# Patient Record
Sex: Female | Born: 1990
Health system: Southern US, Community
[De-identification: ages and names within clinical notes are randomized; demographics above are authoritative.]

## PROBLEM LIST (undated history)

## (undated) DIAGNOSIS — G43909 Migraine, unspecified, not intractable, without status migrainosus: Secondary | ICD-10-CM

## (undated) DIAGNOSIS — S060XAA Concussion with loss of consciousness status unknown, initial encounter: Secondary | ICD-10-CM

## (undated) HISTORY — DX: Concussion with loss of consciousness status unknown, initial encounter: S06.0XAA

## (undated) HISTORY — DX: Migraine, unspecified, not intractable, without status migrainosus: G43.909

## (undated) HISTORY — PX: ANTERIOR CRUCIATE LIGAMENT REPAIR: SHX115

---

## 2010-04-09 ENCOUNTER — Emergency Department: Payer: Self-pay | Admitting: Emergency Medicine

## 2011-11-15 ENCOUNTER — Emergency Department: Payer: Self-pay | Admitting: Emergency Medicine

## 2011-11-16 ENCOUNTER — Emergency Department: Payer: Self-pay | Admitting: Emergency Medicine

## 2012-05-19 ENCOUNTER — Emergency Department: Payer: Self-pay | Admitting: Emergency Medicine

## 2012-05-19 LAB — LIPASE, BLOOD: Lipase: 83 U/L (ref 73–393)

## 2012-05-19 LAB — URINALYSIS, COMPLETE
Bilirubin,UR: NEGATIVE
Blood: NEGATIVE
Glucose,UR: NEGATIVE mg/dL (ref 0–75)
Ketone: NEGATIVE
RBC,UR: 1 /HPF (ref 0–5)
Squamous Epithelial: 3

## 2012-05-19 LAB — CBC
HCT: 40 % (ref 35.0–47.0)
MCV: 92 fL (ref 80–100)
Platelet: 335 10*3/uL (ref 150–440)
RBC: 4.34 10*6/uL (ref 3.80–5.20)
WBC: 15.1 10*3/uL — ABNORMAL HIGH (ref 3.6–11.0)

## 2012-05-19 LAB — COMPREHENSIVE METABOLIC PANEL
Albumin: 4 g/dL (ref 3.4–5.0)
Alkaline Phosphatase: 88 U/L (ref 50–136)
Anion Gap: 4 — ABNORMAL LOW (ref 7–16)
BUN: 12 mg/dL (ref 7–18)
Bilirubin,Total: 0.7 mg/dL (ref 0.2–1.0)
Calcium, Total: 9.5 mg/dL (ref 8.5–10.1)
Co2: 29 mmol/L (ref 21–32)
EGFR (Non-African Amer.): >60
Glucose: 96 mg/dL (ref 65–99)
Osmolality: 273 (ref 275–301)
SGOT(AST): 25 U/L (ref 15–37)
Sodium: 137 mmol/L (ref 136–145)

## 2012-10-04 ENCOUNTER — Emergency Department: Payer: Self-pay | Admitting: Emergency Medicine

## 2012-12-17 ENCOUNTER — Emergency Department: Payer: Self-pay | Admitting: Emergency Medicine

## 2013-04-05 ENCOUNTER — Emergency Department (HOSPITAL_COMMUNITY)
Admission: EM | Admit: 2013-04-05 | Discharge: 2013-04-05 | Disposition: A | Discharge status: Home / Self Care | Payer: BC Managed Care – PPO | Attending: Emergency Medicine | Admitting: Emergency Medicine

## 2013-04-05 ENCOUNTER — Encounter (HOSPITAL_COMMUNITY): Payer: Self-pay | Admitting: Emergency Medicine

## 2013-04-05 DIAGNOSIS — M545 Low back pain, unspecified: Secondary | ICD-10-CM | POA: Insufficient documentation

## 2013-04-05 DIAGNOSIS — R110 Nausea: Secondary | ICD-10-CM | POA: Insufficient documentation

## 2013-04-05 DIAGNOSIS — Z79899 Other long term (current) drug therapy: Secondary | ICD-10-CM | POA: Insufficient documentation

## 2013-04-05 DIAGNOSIS — R300 Dysuria: Secondary | ICD-10-CM | POA: Insufficient documentation

## 2013-04-05 DIAGNOSIS — F17200 Nicotine dependence, unspecified, uncomplicated: Secondary | ICD-10-CM | POA: Insufficient documentation

## 2013-04-05 DIAGNOSIS — N946 Dysmenorrhea, unspecified: Secondary | ICD-10-CM | POA: Insufficient documentation

## 2013-04-05 MED ORDER — NAPROXEN 250 MG PO TABS
500.0000 mg | ORAL_TABLET | Freq: Once | ORAL | Status: AC
  Administered 2013-04-05: 500 mg via ORAL
  Filled 2013-04-05: qty 2

## 2013-04-05 MED ORDER — NAPROXEN 500 MG PO TABS: 500.0000 mg | ORAL_TABLET | Freq: Two times a day (BID) | ORAL | Status: DC

## 2013-04-05 NOTE — ED Provider Notes (Signed)
CSN: [id number]     Arrival date & time 04/05/13  1317 History   First MD Initiated Contact with Patient 04/05/13 1323     Chief Complaint  Patient presents with  . Abdominal Cramping   (Consider location/radiation/quality/duration/timing/severity/associated sxs/prior Treatment) HPI Comments: Patient is a 22 year old female with a history of dysmenorrhea who presents for abdominal cramping associated with her menstrual cycle which began today. Patient states the pain is in her lower abdomen and low back. Cramping has been constant since onset without modifying factors. Patient has taken ibuprofen for her symptoms without relief. Patient states that she usually takes naproxen for her cramps without relief. She endorses associated nausea and pressure with urination which is also typical for her while menstruating. She denies fever, CP, SOB, V/D, vaginal discharge, melena, hematochezia, and numbness/tingling.   Patient is a 22 y.o. female presenting with cramps. The history is provided by the patient. No language interpreter was used.  Abdominal Cramping Associated symptoms include abdominal pain (lower cramping) and nausea. Pertinent negatives include no chest pain or fever.    History reviewed. No pertinent past medical history. History reviewed. No pertinent past surgical history. History reviewed. No pertinent family history. History  Substance Use Topics  . Smoking status: Current Every Day Smoker    Types: Cigarettes  . Smokeless tobacco: Not on file  . Alcohol Use: Yes   OB History   Grav Para Term Preterm Abortions TAB SAB Ect Mult Living                 Review of Systems  Constitutional: Negative for fever.  Respiratory: Negative for shortness of breath.   Cardiovascular: Negative for chest pain.  Gastrointestinal: Positive for nausea and abdominal pain (lower cramping).  Genitourinary: Positive for dysuria.  All other systems reviewed and are negative.    Allergies   Nickel  Home Medications   Current Outpatient Rx  Name  Route  Sig  Dispense  Refill  . Ibuprofen (IBU PO)   Oral   Take 6 tablets by mouth once.         . naproxen (NAPROSYN) 500 MG tablet   Oral   Take 1 tablet (500 mg total) by mouth 2 (two) times daily.   30 tablet   0    BP 143/77  Pulse 94  Temp(Src) 97.6 F (36.4 C) (Oral)  Resp 18  SpO2 99%  LMP 04/05/2013  Physical Exam  Nursing note and vitals reviewed. Constitutional: She is oriented to person, place, and time. She appears well-developed and well-nourished. No distress.  HENT:  Head: Normocephalic and atraumatic.  Mouth/Throat: Oropharynx is clear and moist. No oropharyngeal exudate.  Eyes: Conjunctivae and EOM are normal. Pupils are equal, round, and reactive to light. No scleral icterus.  Neck: Normal range of motion.  Cardiovascular: Normal rate, regular rhythm, normal heart sounds and intact distal pulses.   Pulmonary/Chest: Effort normal and breath sounds normal. No respiratory distress. She has no wheezes. She has no rales.  Abdominal: Soft. She exhibits no distension. There is no tenderness. There is no rebound and no guarding.  No focal tenderness or peritoneal signs  Musculoskeletal: Normal range of motion.  Neurological: She is alert and oriented to person, place, and time.  Skin: Skin is warm and dry. No rash noted. She is not diaphoretic. No erythema. No pallor.  Psychiatric: She has a normal mood and affect. Her behavior is normal.    ED Course  Procedures (including critical care time) Labs  Review Labs Reviewed - No data to display Imaging Review No results found.  EKG Interpretation   None       MDM   1. Dysmenorrhea    22 year old female presents for dysmenorrhea with onset this morning. Patient states that symptoms are c/w prior episodes of dysmenorrhea. No new or worsening symptoms compared to past. Patient is well and nontoxic appearing, hemodynamically stable, and  afebrile. No focal tenderness or peritoneal signs appreciated on abdominal exam. Lungs clear to auscultation bilaterally and heart regular rate and rhythm. Patient given naproxen in the ED for symptoms. She is appropriate for discharge with OB/GYN followup as needed. Will provide prescription for naproxen to take as needed for symptoms. Return precautions discussed and patient agreeable to plan of no unaddressed concerns.    Antony Madura, PA-C 04/05/13 1428

## 2013-04-05 NOTE — ED Notes (Signed)
Reports waking up this am, is having menstrual cycle and severe abd cramping. Pt states she just needs naproxen for the pain. No distress noted at triage.

## 2013-04-05 NOTE — ED Notes (Signed)
Pt dc to home. Pt sts understanding to dc instructions. Pt ambulatory to exit without difficulty.  Pt denies need for w/c.  

## 2013-04-05 NOTE — ED Provider Notes (Signed)
Medical screening examination/treatment/procedure(s) were performed by non-physician practitioner and as supervising physician I was immediately available for consultation/collaboration.  EKG Interpretation   None         Layla Maw Shivam Mestas, DO 04/05/13 1623

## 2013-08-01 ENCOUNTER — Emergency Department: Payer: Self-pay | Admitting: Emergency Medicine

## 2014-02-24 ENCOUNTER — Emergency Department: Payer: Self-pay | Admitting: Emergency Medicine

## 2014-10-11 ENCOUNTER — Emergency Department
Admit: 2014-10-11 | Disposition: A | Discharge status: Home / Self Care | Payer: Self-pay | Admitting: Emergency Medicine

## 2015-11-05 ENCOUNTER — Emergency Department
Admission: EM | Admit: 2015-11-05 | Discharge: 2015-11-05 | Disposition: A | Discharge status: Home / Self Care | Payer: Self-pay | Attending: Emergency Medicine | Admitting: Emergency Medicine

## 2015-11-05 ENCOUNTER — Encounter: Payer: Self-pay | Admitting: Emergency Medicine

## 2015-11-05 DIAGNOSIS — K122 Cellulitis and abscess of mouth: Secondary | ICD-10-CM | POA: Insufficient documentation

## 2015-11-05 DIAGNOSIS — Z791 Long term (current) use of non-steroidal anti-inflammatories (NSAID): Secondary | ICD-10-CM | POA: Insufficient documentation

## 2015-11-05 DIAGNOSIS — F17210 Nicotine dependence, cigarettes, uncomplicated: Secondary | ICD-10-CM | POA: Insufficient documentation

## 2015-11-05 MED ORDER — AMOXICILLIN 500 MG PO TABS: 500.0000 mg | ORAL_TABLET | Freq: Three times a day (TID) | ORAL | Status: DC

## 2015-11-05 MED ORDER — MAGIC MOUTHWASH W/LIDOCAINE: 5.0000 mL | Freq: Four times a day (QID) | ORAL | Status: DC | PRN

## 2015-11-05 NOTE — ED Notes (Signed)
Pt presents to ED c/o swelling and pain to L side of gums starting last night and getting progressively worse. Pt used salt water flush, peroxide, and motrin at home without relief.

## 2015-11-05 NOTE — Discharge Instructions (Signed)
Pericoronitis  Pericoronitis is redness, pain, and swelling (inflammation) of the gum that surrounds a tooth that has not come all the way through the gum (partially erupted or impacted). It occurs when food particles and bacteria get trapped between the gum and the tooth and cause an infection.  Pericoronitis usually affects the bottom wisdom teeth. Wisdom teeth are also called third molars. These are the last teeth to erupt, usually when people are 17-21 years old. Pericoronitis is most common in people in their 20s.  Pericoronitis may start suddenly (acute) and can cause pain and swelling. The infection can also spread to the soft tissues around the tooth and lower jaw.  CAUSES   The bacteria that normally live in your mouth (anaerobic bacteria) are the usual cause of pericoronitis.   RISK FACTORS  The main risk factor for pericoronitis is a wisdom tooth that is coming in slowly or sideways. Other risk factors include:  · Poor dental care (oral hygiene).  · An upper molar that irritates the lower molar when chewing (occlusal trauma).  · Gum disease (gingivitis).  · Lowered resistance to infection. Illness and pregnancy are among possible causes of this.  SIGNS AND SYMPTOMS  Signs and symptoms of acute pericoronitis include:  · Pain.  · Redness and swelling of the gum.  · Swelling of the jaw.  · Bad breath.  · Bad taste in the mouth.  · Fever.  · Stiff and painful jaw movement (trismus).  DIAGNOSIS   Your dentist can usually diagnose pericoronitis by your symptoms. You will also have a dental exam including X-rays.  TREATMENT   Treatment of acute pericoronitis may include:  · Injecting numbing medicine and germ-killing solution into the infected area, then opening the gum area over the tooth to flush out pus, bacteria, and trapped food particles (debris).  · Grinding down an upper tooth that is causing occlusal trauma.  · Prescribing oral antibiotic medicine.  If you have pericoronitis that keeps coming back, you  may need to have the affected wisdom tooth removed (extracted).  HOME CARE INSTRUCTIONS  · Take medicines only as directed by your dentist.  · If you were prescribed an antibiotic medicine, finish it all even if you start to feel better.  · Eat soft foods until pain and swelling have gone away.  · Rinse your mouth with warm salt water every few hours, or use a mouthwash that is recommended by your dentist.  · Practice good oral hygiene by flossing and brushing frequently.  · Keep all follow-up visits as directed by your dentist. This is important.  SEEK MEDICAL CARE IF:  · You have pain or swelling in your gum or jaw.  · You have a fever.  · You have a constant bad taste in your mouth or bad breath.  SEEK IMMEDIATE MEDICAL CARE IF:  You have swelling in your mouth or jaw that makes it hard to swallow or breathe.     This information is not intended to replace advice given to you by your health care provider. Make sure you discuss any questions you have with your health care provider.     Document Released: 07/08/2004 Document Revised: 06/21/2014 Document Reviewed: 10/31/2013  Elsevier Interactive Patient Education ©2016 [employer]

## 2015-11-05 NOTE — ED Provider Notes (Signed)
[facility] Emergency Department Provider Note  ____________________________________________  Time seen: Approximately 3:57 PM  I have reviewed the triage vital signs and the nursing notes.   HISTORY  Chief Complaint Oral Swelling    HPI Stefanie Parks is a 25 y.o. female , NAD, presents to the emergency department with one-day history of swelling to the left side of her gums. States she eats a lot of sunflower seeds and believes a serious stuck inside of the gum of her left lower back molar. Has tried to flush was sought warm peroxide as well as been using a toothpick to try to get the seed out. Does state she removed some of the seed from her gumline. Denies any dental pain nor injury to the tooth. Has not had any fevers or chills. Denies abdominal pain, nausea, vomiting.   History reviewed. No pertinent past medical history.  There are no active problems to display for this patient.   History reviewed. No pertinent past surgical history.  Current Outpatient Rx  Name  Route  Sig  Dispense  Refill  . amoxicillin (AMOXIL) 500 MG tablet   Oral   Take 1 tablet (500 mg total) by mouth 3 (three) times daily with meals.   30 tablet   0   . Ibuprofen (IBU PO)   Oral   Take 6 tablets by mouth once.         . magic mouthwash w/lidocaine SOLN   Oral   Take 5 mLs by mouth 4 (four) times daily as needed for mouth pain.   240 mL   0     Please mix 80mL diphenhydramine, 80mL nystatin, 80 ...   . naproxen (NAPROSYN) 500 MG tablet   Oral   Take 1 tablet (500 mg total) by mouth 2 (two) times daily.   30 tablet   0     Allergies Nickel  History reviewed. No pertinent family history.  Social History Social History  Substance Use Topics  . Smoking status: Current Every Day Smoker    Types: Cigarettes  . Smokeless tobacco: None  . Alcohol Use: Yes     Review of Systems  Constitutional: No fever/chills Eyes: No visual changes.  ENT:  Positive redness swelling to left lower gum line. No cracked teeth or dental caries. Cardiovascular: No chest pain. Respiratory: No cough. No shortness of breath. No wheezing.  Gastrointestinal: No abdominal pain.  No nausea, vomiting.   Musculoskeletal: Negative for neck pain.  Skin: Negative for rash. Neurological: Negative for headaches, focal weakness or numbness. 10-point ROS otherwise negative.  ____________________________________________   PHYSICAL EXAM:  VITAL SIGNS: ED Triage Vitals  Enc Vitals Group     BP --      Pulse --      Resp --      Temp --      Temp src --      SpO2 --      Weight --      Height --      Head Cir --      Peak Flow --      Pain Score --      Pain Loc --      Pain Edu? --      Excl. in GC? --      Constitutional: Alert and oriented. Well appearing and in no acute distress. Eyes: Conjunctivae are normal.  Head: Atraumatic. ENT:            Nose: No  congestion/rhinnorhea.      Mouth/Throat: Mild erythema and swelling noted about the left lower posterior gumline about the last molar. No evidence of foreign body. Mild tenderness to palpation. Dental hygiene is overall good. No evidence of cracked tooth nor [place] in this tooth. Mucous membranes are moist. Pharynx without erythema, swelling, exudates. Uvula is midline. Neck: Supple with full range of motion Hematological/Lymphatic/Immunilogical: No cervical lymphadenopathy. Respiratory: Normal respiratory effort without tachypnea or retractions.  Musculoskeletal: No TMJ pain to palpation or crepitus with opening of the jaw. No jaw tenderness to palpation. Neurologic:  Normal speech and language. No gross focal neurologic deficits are appreciated.  Skin:  Skin is warm, dry and intact. No rash noted. Psychiatric: Mood and affect are normal. Speech and behavior are normal. Patient exhibits appropriate insight and judgement.   ____________________________________________    LABS  None ____________________________________________  EKG  None ____________________________________________  RADIOLOGY  None ____________________________________________    PROCEDURES  Procedure(s) performed: None   Medications - No data to display   ____________________________________________   INITIAL IMPRESSION / ASSESSMENT AND PLAN / ED COURSE  Patient's diagnosis is consistent with cellulitis oral soft tissues. Patient will be discharged home with prescriptions for amoxicillin and Magic mouthwash with lidocaine to use as directed. Patient is to follow up with her dentist for panoramic x-rays and further evaluation as needed. Advised patient to discontinue use of toothpick as it could cause increased infection and open wounds to the gumline. Advised to use floss instead. Patient is given ED precautions to return to the ED for any worsening or new symptoms.    ____________________________________________  FINAL CLINICAL IMPRESSION(S) / ED DIAGNOSES  Final diagnoses:  Cellulitis of oral soft tissues      NEW MEDICATIONS STARTED DURING THIS VISIT:  Discharge Medication List as of 11/05/2015  4:19 PM    START taking these medications   Details  amoxicillin (AMOXIL) 500 MG tablet Take 1 tablet (500 mg total) by mouth 3 (three) times daily with meals., Starting 11/05/2015, Until Discontinued, Print    magic mouthwash w/lidocaine SOLN Take 5 mLs by mouth 4 (four) times daily as needed for mouth pain., Starting 11/05/2015, Until Discontinued, Print             Hope Pigeon, PA-C 11/05/15 1658  Minna Antis, MD 11/05/15 2306

## 2015-11-05 NOTE — ED Notes (Signed)
Discussed discharge instructions, prescriptions, and follow-up care with patient. No questions or concerns at this time. Pt stable at discharge.  

## 2016-04-10 ENCOUNTER — Emergency Department: Payer: PRIVATE HEALTH INSURANCE

## 2016-04-10 ENCOUNTER — Emergency Department
Admission: EM | Admit: 2016-04-10 | Discharge: 2016-04-10 | Disposition: A | Discharge status: Home / Self Care | Payer: PRIVATE HEALTH INSURANCE | Attending: Emergency Medicine | Admitting: Emergency Medicine

## 2016-04-10 DIAGNOSIS — M25461 Effusion, right knee: Secondary | ICD-10-CM | POA: Diagnosis not present

## 2016-04-10 DIAGNOSIS — S8391XA Sprain of unspecified site of right knee, initial encounter: Secondary | ICD-10-CM | POA: Insufficient documentation

## 2016-04-10 DIAGNOSIS — Y929 Unspecified place or not applicable: Secondary | ICD-10-CM | POA: Diagnosis not present

## 2016-04-10 DIAGNOSIS — Y9389 Activity, other specified: Secondary | ICD-10-CM | POA: Diagnosis not present

## 2016-04-10 DIAGNOSIS — F17210 Nicotine dependence, cigarettes, uncomplicated: Secondary | ICD-10-CM | POA: Insufficient documentation

## 2016-04-10 DIAGNOSIS — Y999 Unspecified external cause status: Secondary | ICD-10-CM | POA: Diagnosis not present

## 2016-04-10 DIAGNOSIS — W500XXA Accidental hit or strike by another person, initial encounter: Secondary | ICD-10-CM | POA: Insufficient documentation

## 2016-04-10 DIAGNOSIS — S80911A Unspecified superficial injury of right knee, initial encounter: Secondary | ICD-10-CM | POA: Diagnosis present

## 2016-04-10 MED ORDER — MELOXICAM 15 MG PO TABS: 15.0000 mg | ORAL_TABLET | Freq: Every day | ORAL | 0 refills | Status: DC
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
## 2016-05-07 ENCOUNTER — Emergency Department
Admission: EM | Admit: 2016-05-07 | Discharge: 2016-05-07 | Disposition: A | Discharge status: Home / Self Care | Payer: PRIVATE HEALTH INSURANCE | Attending: Emergency Medicine | Admitting: Emergency Medicine

## 2016-05-07 DIAGNOSIS — M2391 Unspecified internal derangement of right knee: Secondary | ICD-10-CM

## 2016-05-07 DIAGNOSIS — S8391XA Sprain of unspecified site of right knee, initial encounter: Secondary | ICD-10-CM

## 2016-05-07 DIAGNOSIS — Y929 Unspecified place or not applicable: Secondary | ICD-10-CM | POA: Insufficient documentation

## 2016-05-07 DIAGNOSIS — Y999 Unspecified external cause status: Secondary | ICD-10-CM | POA: Insufficient documentation

## 2016-05-07 DIAGNOSIS — X58XXXA Exposure to other specified factors, initial encounter: Secondary | ICD-10-CM | POA: Insufficient documentation

## 2016-05-07 DIAGNOSIS — Y939 Activity, unspecified: Secondary | ICD-10-CM | POA: Insufficient documentation

## 2016-05-07 DIAGNOSIS — F17210 Nicotine dependence, cigarettes, uncomplicated: Secondary | ICD-10-CM | POA: Insufficient documentation

## 2016-05-07 MED ORDER — NAPROXEN 500 MG PO TABS: 500.0000 mg | ORAL_TABLET | Freq: Two times a day (BID) | ORAL | 0 refills | Status: DC

## 2016-05-07 MED ORDER — TRAMADOL HCL 50 MG PO TABS
ORAL_TABLET | ORAL | Status: AC
  Administered 2016-05-07: 50 mg via ORAL
  Filled 2016-05-07: qty 1

## 2016-05-07 MED ORDER — NAPROXEN 500 MG PO TABS
500.0000 mg | ORAL_TABLET | Freq: Once | ORAL | Status: AC
  Administered 2016-05-07: 500 mg via ORAL

## 2016-05-07 MED ORDER — TRAMADOL HCL 50 MG PO TABS: 50.0000 mg | ORAL_TABLET | Freq: Four times a day (QID) | ORAL | 0 refills | Status: DC | PRN

## 2016-05-07 MED ORDER — TRAMADOL HCL 50 MG PO TABS
50.0000 mg | ORAL_TABLET | Freq: Once | ORAL | Status: AC
  Administered 2016-05-07: 50 mg via ORAL

## 2016-05-07 MED ORDER — NAPROXEN 500 MG PO TABS
ORAL_TABLET | ORAL | Status: AC
  Administered 2016-05-07: 23:00:00 500 mg via ORAL
  Filled 2016-05-07: qty 1
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
## 2016-05-07 NOTE — ED Notes (Signed)
Pt alert and oriented X4, active, cooperative, pt in NAD. RR even and unlabored, color WNL.  Pt informed to return if any life threatening symptoms occur.   

## 2016-05-07 NOTE — Discharge Instructions (Signed)
Please take medications as prescribed. Rest ice and elevate knee. Use crutches to help with ambulation and continue with the knee brace. Call orthopedics Monday morning to schedule follow-up appointment. Return to the ER for any worsening symptoms or urgent changes in her health.

## 2016-05-07 NOTE — ED Provider Notes (Signed)
[facility]-EMERGENCY DEPARTMENT Provider Note   CSN: [id number] Arrival date & time: 05/07/16  2215     History   Chief Complaint Chief Complaint  Patient presents with  . Knee Pain    HPI Stefanie Parks is a 25 y.o. female presents to the emergency department for evaluation of right knee pain. Patient originally injured the knee 04/10/2016, had x-ray show no evidence of acute bony abnormality that patient did have an effusion. After several days patient began to ambulate without any assistive devices but continued to have catching and locking sensation along the medial joint line. She is continued to work. Catching and locking is present with squatting pivoting and stairs. Today, she felt the knee locking catch, she twisted the knee and almost fell. She developed increased pain and swelling to the knee. Pain is 4 out of 10. She is using her crutches as well as her hinged knee brace to help with ambulation. Patient did not follow-up with orthopedics.  HPI  No past medical history on file.  There are no active problems to display for this patient.   No past surgical history on file.  OB History    No data available       Home Medications    Prior to Admission medications   Medication Sig Start Date End Date Taking? Authorizing Provider  Ibuprofen (IBU PO) Take 6 tablets by mouth once.    Historical Provider, MD  meloxicam (MOBIC) 15 MG tablet Take 1 tablet (15 mg total) by mouth daily. 04/10/16   Delorise RoyalsJonathan D Cuthriell, PA-C  naproxen (NAPROSYN) 500 MG tablet Take 1 tablet (500 mg total) by mouth 2 (two) times daily with a meal. 05/07/16   Evon Slackhomas C Galo Sayed, PA-C  traMADol (ULTRAM) 50 MG tablet Take 1 tablet (50 mg total) by mouth every 6 (six) hours as needed. 05/07/16   Evon Slackhomas C Arhan Mcmanamon, PA-C    Family History No family history on file.  Social History Social History  Substance Use Topics  . Smoking status: Current Every Day Smoker    Types: Cigarettes  . Smokeless  tobacco: Not on file  . Alcohol use Yes     Allergies   Nickel   Review of Systems Review of Systems  Constitutional: Negative for chills and fever.  HENT: Negative for ear pain and sore throat.   Eyes: Negative for pain and visual disturbance.  Respiratory: Negative for cough and shortness of breath.   Cardiovascular: Negative for chest pain and palpitations.  Gastrointestinal: Negative for abdominal pain and vomiting.  Genitourinary: Negative for dysuria and hematuria.  Musculoskeletal: Positive for arthralgias and joint swelling. Negative for back pain.  Skin: Negative for color change and rash.  Neurological: Negative for seizures and syncope.  All other systems reviewed and are negative.    Physical Exam Updated Vital Signs BP (!) 141/56 (BP Location: Right Arm)   Pulse 72   Temp 97.5 F (36.4 C) (Oral)   Resp 16   LMP 04/22/2016 (Exact Date)   SpO2 98%   Physical Exam  Constitutional: She is oriented to person, place, and time. She appears well-developed and well-nourished. No distress.  HENT:  Head: Normocephalic and atraumatic.  Mouth/Throat: Oropharynx is clear and moist.  Eyes: EOM are normal. Pupils are equal, round, and reactive to light. Right eye exhibits no discharge. Left eye exhibits no discharge.  Neck: Normal range of motion. Neck supple.  Cardiovascular: Normal rate and intact distal pulses.   Pulmonary/Chest: Effort normal. No respiratory distress.  Musculoskeletal:  Right Lower Extremities: Examination of the right lower extremity reveals no bony abnormality, no edema, no effusion and no ecchymosis.  There is no valgus or varus abnormality.  The patient is non-tender along the lateral joint line, and is tender along the medial joint line.  The patient has 0-95 range of motion. Patient is able to actively extend the knee there is discomfort with hyperflexion.  The patient has a positive medial rotational Mcmurray test.  There is no retropatellar  discomfort.  The patient has a negative patella stretch test.  The patient has a negative varus stress test and a negative valgus stress test, in looking for stability.  The patient has a negative Lachman's test.  Vascular: The patient has a negative Denna HaggardHomans' test bilaterally.  The patient had a normal dorsalis pedis and posterior tibial pulse.  There is normal skin warmth.  There is normal capillary refill bilaterally.    Neurologic: The patient has a negative straight leg raise.  The patient has normal muscle strength testing for the quadriceps, calves, ankle dorsiflexion, ankle plantarflexion, and extensor hallicus longus.  The patient has sensation that is intact to light touch.  Neurological: She is alert and oriented to person, place, and time. She has normal reflexes.  Skin: Skin is warm and dry.  Psychiatric: She has a normal mood and affect. Her behavior is normal. Thought content normal.       ED Treatments / Results  Labs (all labs ordered are listed, but only abnormal results are displayed) Labs Reviewed - No data to display  EKG  EKG Interpretation None       Radiology No results found.  Procedures Procedures (including critical care time)  Medications Ordered in ED Medications  traMADol (ULTRAM) tablet 50 mg (not administered)  naproxen (NAPROSYN) tablet 500 mg (not administered)  naproxen (NAPROSYN) 500 MG tablet (not administered)  traMADol (ULTRAM) 50 MG tablet (not administered)     Initial Impression / Assessment and Plan / ED Course  I have reviewed the triage vital signs and the nursing notes.  Pertinent labs & imaging results that were available during my care of the patient were reviewed by me and considered in my medical decision making (see chart for details).  Clinical Course     25 year old female with original knee injury on 04/10/2016, no evidence of acute bony abnormality on x-rays, x-rays did show significant suprapatellar effusion.  Patient has had mechanical knee pain since this injury she has not followed up with orthopedics. Today she had significant discomfort or catching and locking along the medial joint line and caused her to nearly fall. Patient will continue with crutches, hinged knee brace. No X-rays were taken today. Patient will follow-up with orthopedics. She is given a prescription for tramadol and naproxen.  Final Clinical Impressions(s) / ED Diagnoses   Final diagnoses:  Sprain of right knee, unspecified ligament, initial encounter  Internal derangement of right knee    New Prescriptions New Prescriptions   NAPROXEN (NAPROSYN) 500 MG TABLET    Take 1 tablet (500 mg total) by mouth 2 (two) times daily with a meal.   TRAMADOL (ULTRAM) 50 MG TABLET    Take 1 tablet (50 mg total) by mouth every 6 (six) hours as needed.     Evon Slackhomas C Justn Quale, PA-C 05/07/16 2255    Minna AntisKevin Paduchowski, MD 05/07/16 [phone]

## 2016-05-07 NOTE — ED Notes (Signed)
ED Provider at bedside. 

## 2017-10-09 ENCOUNTER — Emergency Department: Payer: PRIVATE HEALTH INSURANCE

## 2017-10-09 ENCOUNTER — Emergency Department
Admission: EM | Admit: 2017-10-09 | Discharge: 2017-10-09 | Disposition: A | Discharge status: Home / Self Care | Payer: PRIVATE HEALTH INSURANCE | Attending: Emergency Medicine | Admitting: Emergency Medicine

## 2017-10-09 ENCOUNTER — Other Ambulatory Visit: Payer: Self-pay

## 2017-10-09 DIAGNOSIS — Z5321 Procedure and treatment not carried out due to patient leaving prior to being seen by health care provider: Secondary | ICD-10-CM | POA: Insufficient documentation

## 2017-10-09 DIAGNOSIS — M25569 Pain in unspecified knee: Secondary | ICD-10-CM | POA: Insufficient documentation

## 2017-10-09 NOTE — ED Triage Notes (Signed)
Reports tonight while standing and getting ready to move her body moved but knee did not.  Reports injury to same knee 3 years ago, but did not follow up and have surgery as recommended.

## 2017-10-09 NOTE — ED Notes (Signed)
Per radiology, patient refused to have x-ray at this time.

## 2018-07-19 DIAGNOSIS — J0140 Acute pansinusitis, unspecified: Secondary | ICD-10-CM | POA: Diagnosis not present

## 2018-10-29 DIAGNOSIS — Z23 Encounter for immunization: Secondary | ICD-10-CM | POA: Diagnosis not present

## 2018-10-29 DIAGNOSIS — M7989 Other specified soft tissue disorders: Secondary | ICD-10-CM | POA: Diagnosis not present

## 2018-10-29 DIAGNOSIS — S61432A Puncture wound without foreign body of left hand, initial encounter: Secondary | ICD-10-CM | POA: Diagnosis not present

## 2018-10-29 DIAGNOSIS — S61452A Open bite of left hand, initial encounter: Secondary | ICD-10-CM | POA: Diagnosis not present

## 2018-10-29 DIAGNOSIS — L539 Erythematous condition, unspecified: Secondary | ICD-10-CM | POA: Diagnosis not present

## 2018-10-29 DIAGNOSIS — S51851A Open bite of right forearm, initial encounter: Secondary | ICD-10-CM | POA: Diagnosis not present

## 2018-12-13 ENCOUNTER — Other Ambulatory Visit: Payer: Self-pay

## 2018-12-13 ENCOUNTER — Ambulatory Visit
Admission: EM | Admit: 2018-12-13 | Discharge: 2018-12-13 | Disposition: A | Discharge status: Home / Self Care | Payer: Commercial Managed Care - PPO | Attending: Emergency Medicine | Admitting: Emergency Medicine

## 2018-12-13 ENCOUNTER — Encounter: Payer: Self-pay | Admitting: Emergency Medicine

## 2018-12-13 DIAGNOSIS — M25531 Pain in right wrist: Secondary | ICD-10-CM | POA: Diagnosis not present

## 2018-12-13 MED ORDER — METHYLPREDNISOLONE 4 MG PO TBPK: ORAL_TABLET | Freq: Every day | ORAL | 0 refills | Status: DC

## 2018-12-13 MED ORDER — IBUPROFEN 600 MG PO TABS: 600.0000 mg | ORAL_TABLET | Freq: Four times a day (QID) | ORAL | 0 refills | Status: DC | PRN

## 2018-12-13 NOTE — Discharge Instructions (Signed)
I am giving you information on carpal tunnel syndrome, this would be a very atypical presentation of this.  Ice your wrist for 20 minutes at a time.  600 mg ibuprofen combined with 1 g of Tylenol 3-4 times a day as needed for pain.  Finish the steroids.  Follow up with the hand surgeon if you are not better after finishing the steroids.  Here is a list of primary care providers who are taking new patients:  Dr. Otilio Miu, Dr. Adline Potter [address] [place] [address] [place] [address]  [phone]  [facility] [address] [place], [place] [address] [phone]  [facility]  [phone] [place], [place] [address]  Here are clinics/ other resources who will see you if you do not have insurance. Some have certain criteria that you must meet. Call them and find out what they are:  [facility]: [address] [place], [place] [address] Phone: [phone] Hours: First and Third Saturdays of each Month, 9 a.m. - 1 p.m.  [facility]: [address], [place], [place], [place], [place] [address] Phone: [phone] Hours: Tuesday, 4 p.m. - 8 p.m. Thursday, 1 p.m. - 8 p.m. Wednesday, 9 a.m. - [facility] [address] [place], [place] [address] Phone: [phone] Pharmacy Phone Number: [phone] Dental Phone Number: [phone] [place] Help: [phone]  Dental Hours: Monday - Thursday, 8 a.m. - 6 p.m.  [place] [address] [place], [place] [address] Phone: [phone] Pharmacy Phone Number: [phone] [facility] Insurance Help: [phone]  [facility] [address] [place], [place] [address] Phone: [phone] Pharmacy Phone Number: [phone] [facility] Insurance Help: [phone]  [facility] [address] [place], [place] [address] Phone:  [phone] [facility] Insurance Help: [phone]   [address] [place], [place] [address] Phone: [phone]  Go to www.goodrx.com to look up your medications. This will give you a list of where you can find your prescriptions at the most affordable prices. Or ask the pharmacist what the cash price is, or if they have any other discount programs available to help make your medication more affordable. This can be less expensive than what you would pay with insurance.

## 2018-12-13 NOTE — ED Provider Notes (Signed)
HPI  SUBJECTIVE:  Stefanie Parks is a left handed 28 y.o. female who presents with shocking" ulnar wrist pain and hand numbness and tingling for the past several months.  Patient states that she is now dropping objects.  She states that the pain starts in her wrist and radiates up into her elbow.  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  She denies trauma to her wrist or hand.  No bruising, erythema, swelling, fevers.  The pain is worse at night.  Symptoms are worse with opening bottles, heavy objects, using her wrist and thumb, better with stretching.  She has also tried elevation.  He has not tried anything else for this.  She has a past medical history of right ulnar fracture at age 2, no history of carpal tunnel syndrome, diabetes.  LMP: 2 weeks ago.  Denies the possibility of being pregnant.  PMD: None.  History reviewed. No pertinent past medical history.  History reviewed. No pertinent surgical history.  Family History  Adopted: Yes    Social History   Tobacco Use  . Smoking status: Current Every Day Smoker    Types: Cigarettes  . Smokeless tobacco: Never Used  Substance Use Topics  . Alcohol use: Yes  . Drug use: No    No current facility-administered medications for this encounter.   Current Outpatient Medications:  .  ibuprofen (ADVIL) 600 MG tablet, Take 1 tablet (600 mg total) by mouth every 6 (six) hours as needed., Disp: 30 tablet, Rfl: 0 .  methylPREDNISolone (MEDROL DOSEPAK) 4 MG TBPK tablet, Take by mouth daily. Follow package instructions, Disp: 21 tablet, Rfl: 0  Allergies  Allergen Reactions  . Nickel      ROS  As noted in HPI.   Physical Exam  BP 131/86 (BP Location: Left Arm)   Pulse 75   Temp 98.2 F (36.8 C) (Oral)   Resp 14   Ht 5\' 3"  (1.6 m)   Wt 81.6 kg   LMP 11/29/2018 (Approximate)   SpO2 100%   BMI 31.89 kg/m   Constitutional: Well developed, well nourished, no acute distress Eyes:  EOMI, conjunctiva normal  bilaterally HENT: Normocephalic, atraumatic,mucus membranes moist Respiratory: Normal inspiratory effort Cardiovascular: Normal rate GI: nondistended skin: No rash, skin inTact Musculoskeletal:R wrist normal appearance, hand pink, warm.  distal radius NT , distal ulnar styloid NT, snuffbox NT, carpals mildly tender, metacarpals NT, digits NT, TFCC NT. Motor intact ability to flex / extend digits of affected hand, Sensation LT to hand normal.  Tinel neg . Phalen neg- actuallly improves symptoms.  Finklestein  neg. Elbow and proximal forearm NT. Neurologic: Alert & oriented x 3, no focal neuro deficits Psychiatric: Speech and behavior appropriate   ED Course   Medications - No data to display  No orders of the defined types were placed in this encounter.   No results found for this or any previous visit (from the past 24 hour(s)). No results found.  ED Clinical Impression  1. Wrist pain, right      ED Assessment/Plan  Appears to be some wrist impingement.  It would be a very atypical presentation of carpal tunnel as a gets better with Phalen's test.  Will treat with a 6-day Medrol Dosepak, regular NSAID/Tylenol.  Patient declined wrist splint, stating that it feels better when she moves her wrist.  No evidemce of neurovascular compromise.  Deferred imaging in the absence of trauma or bony tenderness.  Patient agrees with that.  Follow-up  with Dr. Stephenie AcresSoria, hand surgeon, if not better with conservative treatment in a week.  Discussed MDM, treatment plan, and plan for follow-up with patient. Discussed sn/sx that should prompt return to the ED. patient agrees with plan.   Meds ordered this encounter  Medications  . ibuprofen (ADVIL) 600 MG tablet    Sig: Take 1 tablet (600 mg total) by mouth every 6 (six) hours as needed.    Dispense:  30 tablet    Refill:  0  . methylPREDNISolone (MEDROL DOSEPAK) 4 MG TBPK tablet    Sig: Take by mouth daily. Follow package instructions    Dispense:   21 tablet    Refill:  0    *This clinic note was created using Dragon dictation software. Therefore, there may be occasional mistakes despite careful proofreading.   ?   Domenick GongMortenson, Sosie Gato, MD 12/13/18 1642

## 2018-12-13 NOTE — ED Triage Notes (Signed)
Patient c/o pain off and on in her right thumb that goes up into her wrist and right forearm for a month.  Patient reports some numbness in her right thumb and wrist. Patient denies injury or fall.

## 2020-03-04 ENCOUNTER — Other Ambulatory Visit: Payer: Self-pay | Admitting: Physician Assistant

## 2020-03-04 DIAGNOSIS — M25511 Pain in right shoulder: Secondary | ICD-10-CM

## 2020-03-04 DIAGNOSIS — M79631 Pain in right forearm: Secondary | ICD-10-CM

## 2020-03-21 ENCOUNTER — Ambulatory Visit: Payer: Commercial Managed Care - PPO

## 2020-03-21 ENCOUNTER — Other Ambulatory Visit: Payer: Commercial Managed Care - PPO

## 2020-03-25 ENCOUNTER — Ambulatory Visit: Payer: Commercial Managed Care - PPO

## 2020-03-25 ENCOUNTER — Inpatient Hospital Stay: Admission: RE | Admit: 2020-03-25 | Payer: Commercial Managed Care - PPO | Source: Ambulatory Visit

## 2020-03-25 ENCOUNTER — Other Ambulatory Visit: Payer: Commercial Managed Care - PPO

## 2020-03-25 ENCOUNTER — Ambulatory Visit: Admission: RE | Admit: 2020-03-25 | Payer: Commercial Managed Care - PPO | Source: Ambulatory Visit

## 2020-03-26 ENCOUNTER — Ambulatory Visit
Admission: RE | Admit: 2020-03-26 | Discharge: 2020-03-26 | Disposition: A | Discharge status: Home / Self Care | Payer: Commercial Managed Care - PPO | Source: Ambulatory Visit | Attending: Physician Assistant | Admitting: Physician Assistant

## 2020-03-26 ENCOUNTER — Ambulatory Visit: Payer: Commercial Managed Care - PPO

## 2020-03-26 ENCOUNTER — Other Ambulatory Visit: Payer: Self-pay

## 2020-03-26 DIAGNOSIS — M25511 Pain in right shoulder: Secondary | ICD-10-CM

## 2020-03-26 DIAGNOSIS — M79631 Pain in right forearm: Secondary | ICD-10-CM

## 2020-03-26 MED ORDER — IOHEXOL 180 MG/ML  SOLN
20.0000 mL | Freq: Once | INTRAMUSCULAR | Status: AC
  Administered 2020-03-26: 15 mL

## 2020-03-26 MED ORDER — GADOBUTROL 1 MMOL/ML IV SOLN
0.1000 mL | Freq: Once | INTRAVENOUS | Status: AC | PRN
  Administered 2020-03-26: 0.1 mL

## 2020-03-26 MED ORDER — SODIUM CHLORIDE (PF) 0.9 % IJ SOLN
10.0000 mL | Freq: Once | INTRAMUSCULAR | Status: AC
  Administered 2020-03-26: 10 mL

## 2020-03-26 MED ORDER — LIDOCAINE HCL (PF) 1 % IJ SOLN
5.0000 mL | Freq: Once | INTRAMUSCULAR | Status: AC
  Administered 2020-03-26: 5 mL via INTRADERMAL
  Filled 2020-03-26: qty 5

## 2020-10-24 IMAGING — RF DG FLUORO GUIDE NDL PLC/BX
2 series · 8 of 8 positions shown · non-contrast
Comparison: none

CLINICAL DATA: Right shoulder pain

[Series 1: cp_standard · 0.19mm/px · 4 of 22 frames shown (1 of 2)]
[frame 3/22]
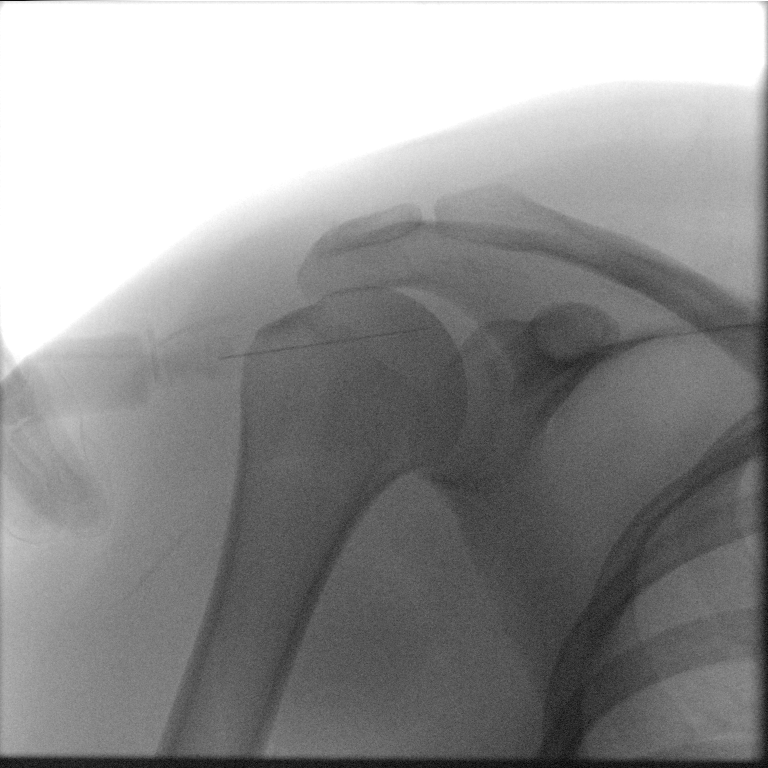
[frame 4/22]
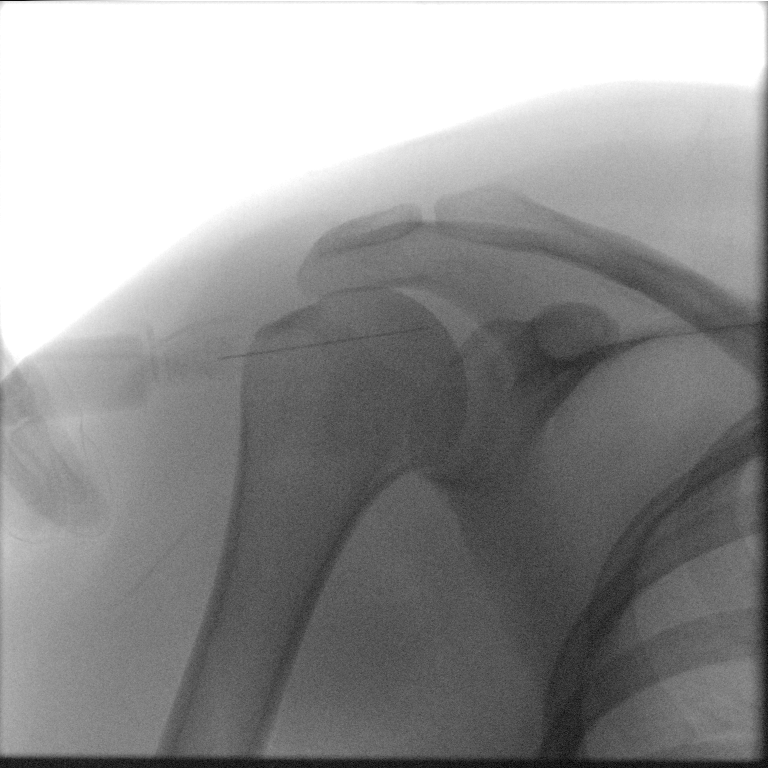
[frame 12/22]
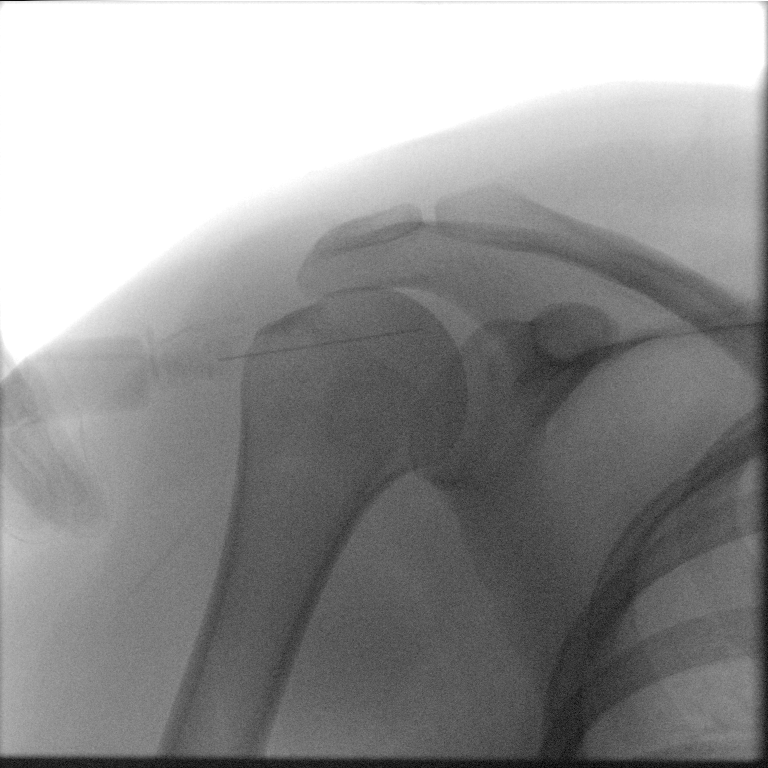
[frame 19/22]
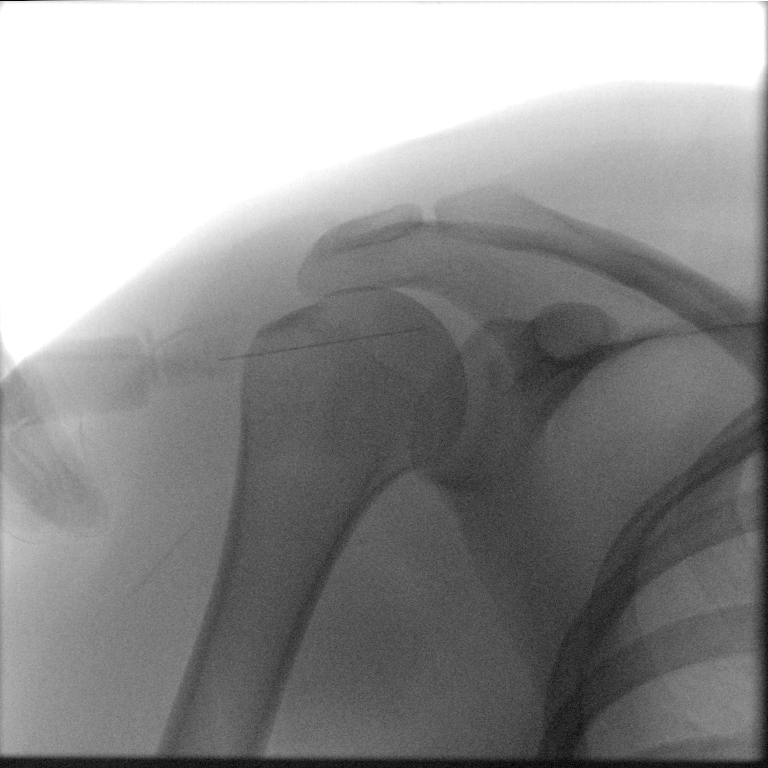

[Series 2: cp_standard · 0.19mm/px · 4 of 31 frames shown (2 of 2)]
[frame 5/31]
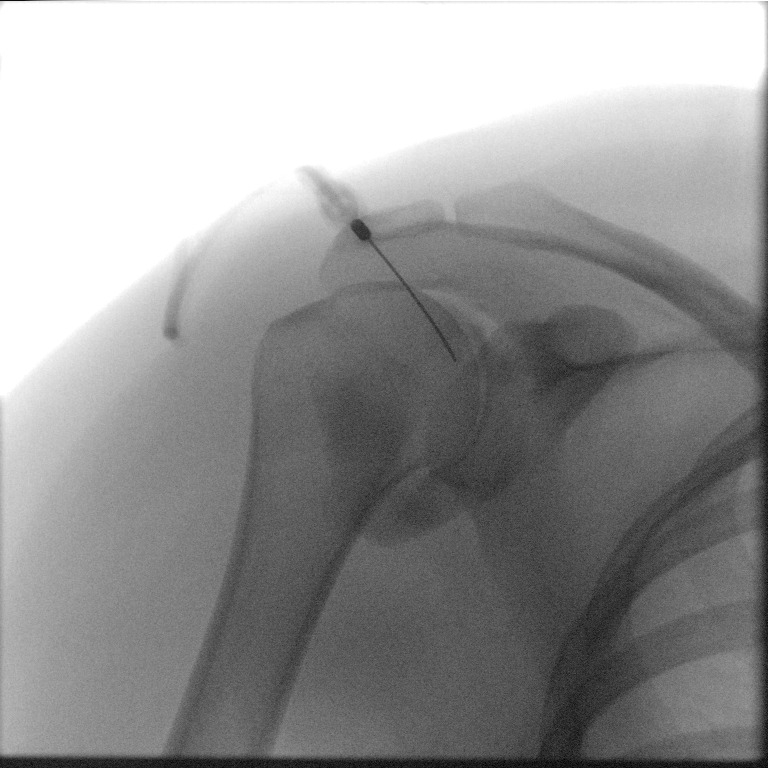
[frame 13/31]
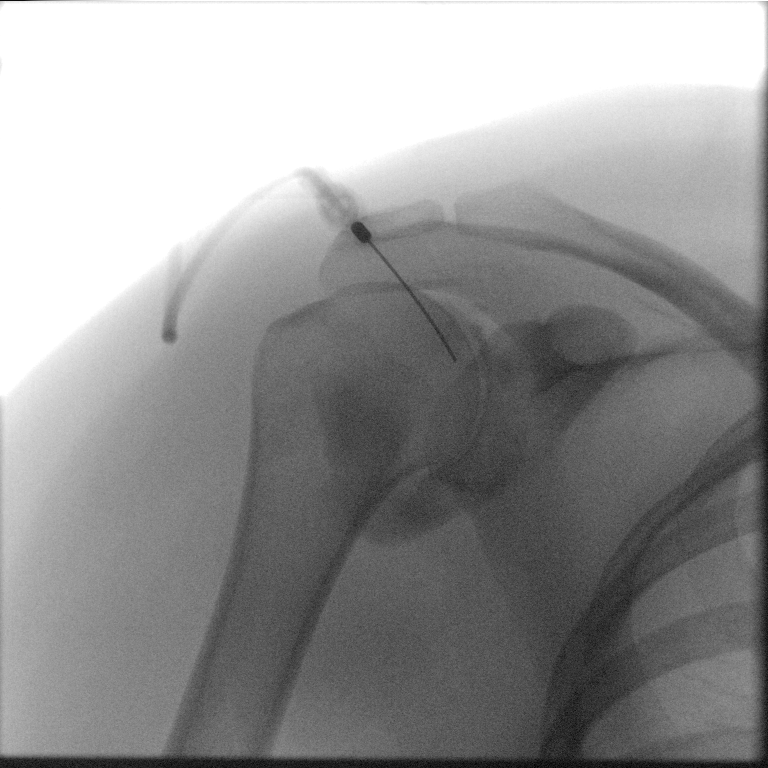
[frame 16/31]
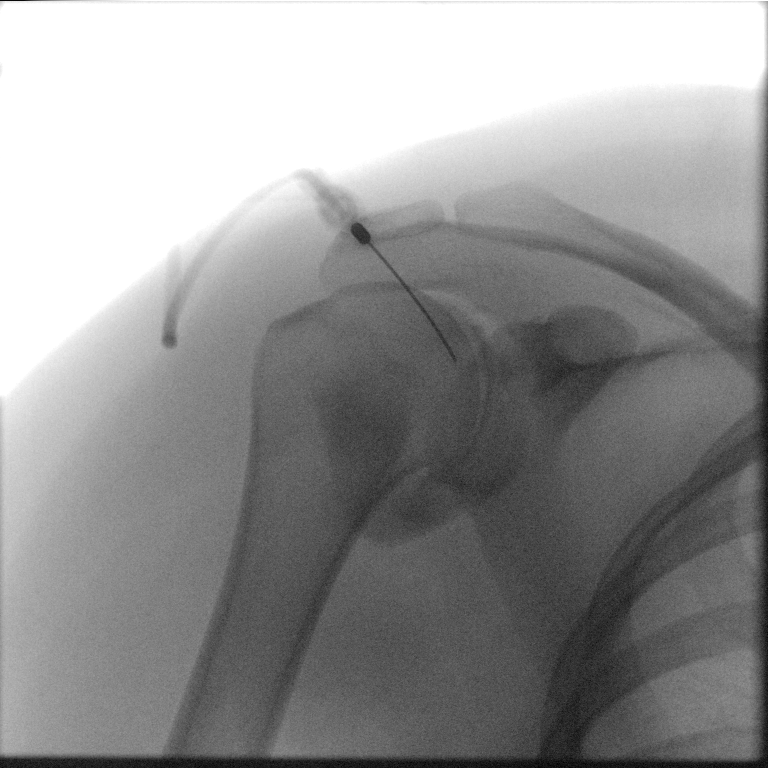
[frame 27/31]
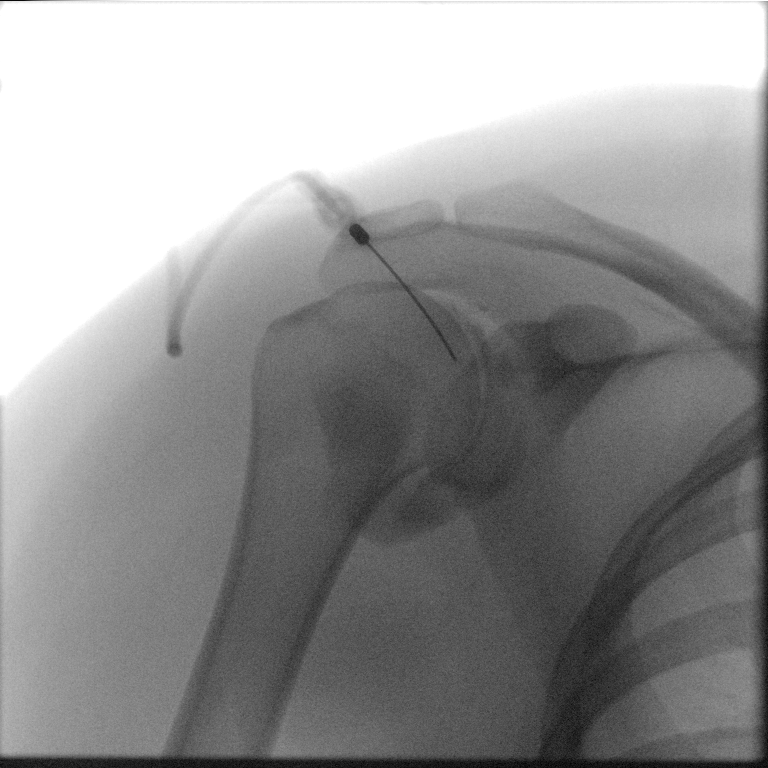

[8 of 8 positions shown; findings below may reference images not displayed]

EXAM:
RIGHT SHOULDER ARTHROGRAM UNDER FLUOROSCOPY

FLUOROSCOPY TIME:  Fluoroscopy Time:  1 minutes and 36 seconds.

Number of Acquired Spot Images: 1

PROCEDURE:
After a thorough discussion of risks and benefits of the procedure,
written and oral informed consent was obtained.

Preliminary localization was performed over the right shoulder. The
area was marked over superomedial aspect of the anterior humeral
head.

After prep and drape in the usual sterile fashion, the skin and
deeper subcutaneous tissues were anesthetized with 1% Lidocaine
without Epinephrine. Under fluoroscopic guidance, a 22 gauge
inch spinal needle was advanced into the joint over the superomedial
aspect of the humeral head using an anterior approach.
Intra-articular injection of Lidocaine was performed which flowed
freely and subsequently the joint was distended with 10 ml of a
[DATE] dilution of Gadavist contrast. The MR arthrogram solution was
as follows: 5 mL Umnipaque-GKK contrast agent, 0.05 mL Gadavist, 15
mL sterile saline. An end point was felt and the injection was
discontinued, the needle removed, and a sterile dressing applied.
The patient was taken to MRI for subsequent imaging.

The patient tolerated the procedure well and there were no
complications.
IMPRESSION: Technically successful right shoulder arthrogram under fluoroscopy.

## 2022-06-29 ENCOUNTER — Ambulatory Visit (INDEPENDENT_AMBULATORY_CARE_PROVIDER_SITE_OTHER): Payer: Commercial Managed Care - PPO | Admitting: Nurse Practitioner

## 2022-06-29 ENCOUNTER — Encounter: Payer: Self-pay | Admitting: Nurse Practitioner

## 2022-06-29 VITALS — BP 122/68 | HR 63 | Ht 63.0 in | Wt 156.0 lb

## 2022-06-29 DIAGNOSIS — L608 Other nail disorders: Secondary | ICD-10-CM | POA: Diagnosis not present

## 2022-06-29 DIAGNOSIS — S20361S Insect bite (nonvenomous) of right front wall of thorax, sequela: Secondary | ICD-10-CM

## 2022-06-29 DIAGNOSIS — R1012 Left upper quadrant pain: Secondary | ICD-10-CM | POA: Insufficient documentation

## 2022-06-29 DIAGNOSIS — G43109 Migraine with aura, not intractable, without status migrainosus: Secondary | ICD-10-CM | POA: Insufficient documentation

## 2022-06-29 DIAGNOSIS — W57XXXA Bitten or stung by nonvenomous insect and other nonvenomous arthropods, initial encounter: Secondary | ICD-10-CM

## 2022-06-29 DIAGNOSIS — Z8659 Personal history of other mental and behavioral disorders: Secondary | ICD-10-CM | POA: Diagnosis not present

## 2022-06-29 DIAGNOSIS — R110 Nausea: Secondary | ICD-10-CM | POA: Diagnosis not present

## 2022-06-29 DIAGNOSIS — W57XXXS Bitten or stung by nonvenomous insect and other nonvenomous arthropods, sequela: Secondary | ICD-10-CM

## 2022-06-29 DIAGNOSIS — F419 Anxiety disorder, unspecified: Secondary | ICD-10-CM

## 2022-06-29 DIAGNOSIS — S20361A Insect bite (nonvenomous) of right front wall of thorax, initial encounter: Secondary | ICD-10-CM

## 2022-06-29 DIAGNOSIS — F32A Depression, unspecified: Secondary | ICD-10-CM

## 2022-06-29 HISTORY — DX: Bitten or stung by nonvenomous insect and other nonvenomous arthropods, initial encounter: W57.XXXA

## 2022-06-29 HISTORY — DX: Other nail disorders: L60.8

## 2022-06-29 LAB — CBC
HCT: 40.7 % (ref 36.0–46.0)
Hemoglobin: 14 g/dL (ref 12.0–15.0)
MCHC: 34.3 g/dL (ref 30.0–36.0)
MCV: 90.2 fl (ref 78.0–100.0)
Platelets: 400 10*3/uL (ref 150.0–400.0)
RBC: 4.51 Mil/uL (ref 3.87–5.11)
RDW: 13.5 % (ref 11.5–15.5)
WBC: 6.4 10*3/uL (ref 4.0–10.5)

## 2022-06-29 LAB — IBC + FERRITIN
Ferritin: 96.8 ng/mL (ref 10.0–291.0)
Iron: 81 ug/dL (ref 42–145)
Saturation Ratios: 21.3 % (ref 20.0–50.0)
TIBC: 380.8 ug/dL (ref 250.0–450.0)
Transferrin: 272 mg/dL (ref 212.0–360.0)

## 2022-06-29 LAB — COMPREHENSIVE METABOLIC PANEL
ALT: 40 U/L — ABNORMAL HIGH (ref 0–35)
AST: 20 U/L (ref 0–37)
Albumin: 4.7 g/dL (ref 3.5–5.2)
Alkaline Phosphatase: 52 U/L (ref 39–117)
BUN: 15 mg/dL (ref 6–23)
CO2: 27 mEq/L (ref 19–32)
Calcium: 9.4 mg/dL (ref 8.4–10.5)
Chloride: 102 mEq/L (ref 96–112)
Creatinine, Ser: 0.77 mg/dL (ref 0.40–1.20)
GFR: 102.48 mL/min (ref >60.00)
Glucose, Bld: 93 mg/dL (ref 70–99)
Potassium: 4.3 mEq/L (ref 3.5–5.1)
Sodium: 137 mEq/L (ref 135–145)
Total Bilirubin: 0.4 mg/dL (ref 0.2–1.2)
Total Protein: 7.5 g/dL (ref 6.0–8.3)

## 2022-06-29 LAB — SEDIMENTATION RATE: Sed Rate: 14 mm/hr (ref 0–20)

## 2022-06-29 NOTE — Progress Notes (Signed)
New Patient Office Visit  Subjective    Patient ID: Stefanie Parks, female    DOB: 11-03-1990  Age: 32 y.o. MRN: [id number]  CC:  Chief Complaint  Patient presents with   Establish Care    HPI Stefanie Parks presents to establish care  Migraines: Starts out feeling nauseous. The light and sound makes it worse. States that her ears are clogged and she can blow her nose and feel the pop. States that nausea and sensitivity will start then the headache starts. States that she has had it twice that were really bad. States that she can tell when it is coming on and can eat and it will calm it down sometimes. States that she can get headaches approx weekly currently. States that she had some vertigo and the meclizine has helped that was given at the ED. She also had a CT head that was negative in the ED.  States that she is doing a log of her food. States that she is trying to be more aware of her diet and calmed down on her diet.  States that she has done protonix and omeprazole has helped in the past. Has tried scarafate also . States that they did test her for H. Pylori in the past that was negative   States that she got sick in July. States that she ended on the floor throwing up and shaking. She was taken to the hosptial. States that she did not have a cause of the pain. States that is was epigastric to the left upper quadrant. States that she lost 10 pounds ina week. States that she has had an endoscopy, colonosocpy, xray and [place] that all checked out. States that she ha  Depression: currently on zoloft. Has improved since starting the medication. States that she is present when she is thtere. States that she started therpay and complex PTSD. Tele doc.   States that she is on birth control and she will skip the periods. States that she will have a perido 4 times. States that it will be heavy   Outpatient Encounter Medications as of 06/29/2022  Medication Sig   dicyclomine (BENTYL) 20 MG  tablet Take 20 mg by mouth every 6 (six) hours.   ibuprofen (ADVIL) 600 MG tablet Take 1 tablet (600 mg total) by mouth every 6 (six) hours as needed.   levonorgestrel-ethinyl estradiol (ALTAVERA) 0.15-30 MG-MCG tablet Take 1 tablet by mouth daily.   meclizine (ANTIVERT) 25 MG tablet Take 25 mg by mouth 3 (three) times daily as needed for dizziness.   Sertraline HCl 150 MG CAPS Take by mouth.   [DISCONTINUED] methylPREDNISolone (MEDROL DOSEPAK) 4 MG TBPK tablet Take by mouth daily. Follow package instructions   No facility-administered encounter medications on file as of 06/29/2022.    Past Medical History:  Diagnosis Date   Concussion    x2   Migraines     Past Surgical History:  Procedure Laterality Date   ANTERIOR CRUCIATE LIGAMENT REPAIR     right    Family History  Adopted: Yes    Social History   Socioeconomic History   Marital status: Significant Other    Spouse name: Not on file   Number of children: 0   Years of education: Not on file   Highest education level: High school graduate  Occupational History   Not on file  Tobacco Use   Smoking status: Former    Types: Cigarettes   Smokeless tobacco: Never  Vaping Use  Vaping Use: Every day   Substances: Nicotine, Flavoring  Substance and Sexual Activity   Alcohol use: Not Currently   Drug use: No    Comment: delta 8   Sexual activity: Yes    Birth control/protection: None  Other Topics Concern   Not on file  Social History Narrative   Fulltime: [employer]      Hobbies:exercise, bike riding, active Therapist, music   Social Determinants of Health   Financial Resource Strain: Not on file  Food Insecurity: Not on file  Transportation Needs: Not on file  Physical Activity: Not on file  Stress: Not on file  Social Connections: Not on file  Intimate Partner Violence: Not on file    Review of Systems  Constitutional:  Negative for chills and fever.  Respiratory:  Negative for shortness of breath.   Cardiovascular:   Negative for chest pain.  Gastrointestinal:  Positive for nausea. Negative for abdominal pain, constipation, diarrhea and vomiting.       BM daily to every other that is loose and soft stools   Genitourinary:  Negative for dysuria and frequency.  Neurological:  Positive for headaches.  Psychiatric/Behavioral:  Negative for hallucinations and suicidal ideas.         Objective    BP 122/68   Pulse 63   Ht 5\' 3"  (1.6 m)   Wt 156 lb (70.8 kg)   SpO2 98%   BMI 27.63 kg/m   Physical Exam Vitals and nursing note reviewed.  Constitutional:      Appearance: Normal appearance.  HENT:     Right Ear: Tympanic membrane, ear canal and external ear normal.     Left Ear: Tympanic membrane, ear canal and external ear normal.     Mouth/Throat:     Mouth: Mucous membranes are moist.     Pharynx: Oropharynx is clear.  Eyes:     Extraocular Movements: Extraocular movements intact.     Pupils: Pupils are equal, round, and reactive to light.  Cardiovascular:     Rate and Rhythm: Normal rate and regular rhythm.     Pulses: Normal pulses.     Heart sounds: Normal heart sounds.  Pulmonary:     Effort: Pulmonary effort is normal.     Breath sounds: Normal breath sounds.  Abdominal:     General: Bowel sounds are normal. There is no distension.     Palpations: There is no mass.     Tenderness: There is abdominal tenderness.     Hernia: No hernia is present.    Musculoskeletal:     Right lower leg: No edema.     Left lower leg: No edema.  Lymphadenopathy:     Cervical: No cervical adenopathy.  Skin:    General: Skin is warm.  Neurological:     General: No focal deficit present.     Mental Status: She is alert.     Deep Tendon Reflexes:     Reflex Scores:      Bicep reflexes are 2+ on the right side and 2+ on the left side.      Patellar reflexes are 2+ on the right side and 2+ on the left side.    Comments: Bilateral upper and lower extremity strength 5/5  Psychiatric:         Attention and Perception: Attention normal.        Mood and Affect: Mood is anxious.        Speech: Speech normal.  Behavior: Behavior normal.        Thought Content: Thought content normal.        Cognition and Memory: Cognition normal.         Assessment & Plan:   Problem List Items Addressed This Visit       Cardiovascular and Mediastinum   Migraine with aura and without status migrainosus, not intractable    Has had 2 major events does happen sometimes weekly.  Patient can eat to get them to stay away.  Pending blood work before we do any abortive or preventative medications.      Relevant Medications   Sertraline HCl 150 MG CAPS     Musculoskeletal and Integument   Tick bite of right front wall of thorax - Primary    Given patient having abdominal discomfort and active outdoors with history of tick bites will check alpha gal      Relevant Orders   Alpha-Gal Panel   Celiac Pnl 2 rflx Endomysial Ab Ttr   Nail pitting    Patient having pitting to some of her nails at least 2-3 per side.  Check iron studies today      Relevant Orders   IBC + Ferritin     Other   Anxiety and depression    Currently followed by psychiatry versus a telemedicine service.  Patient is currently maintained on sertraline 150 mg.  Patient denies HI/SI/AVH.  PHQ-9 and GAD-7 administered in office.      Relevant Medications   Sertraline HCl 150 MG CAPS   History of ADHD    Patient states she is to be on what she believes was Strattera in the past.  When she was younger this was in the childhood diagnosis.  Ambulatory referral to psychology for retesting.  If positive patient is doing telemedicine psychiatry      Relevant Orders   Ambulatory referral to Psychology   Left upper quadrant abdominal pain    Has had workup with GI and labs.  Ambiguous in nature.  Pending labs      Relevant Orders   CBC   Comprehensive metabolic panel   Alpha-Gal Panel   Sedimentation rate   Celiac  Pnl 2 rflx Endomysial Ab Ttr   Nausea    Ambiguous in nature check celiac panel along with alpha gal.  Patient started to rule out with H. pylori has tried PPIs and Carafate.      Relevant Orders   Comprehensive metabolic panel   Alpha-Gal Panel   Sedimentation rate   Celiac Pnl 2 rflx Endomysial Ab Ttr    Return in about 6 months (around 12/28/2022) for CPE and Labs.   Romilda Garret, NP

## 2022-06-29 NOTE — Assessment & Plan Note (Signed)
Has had 2 major events does happen sometimes weekly.  Patient can eat to get them to stay away.  Pending blood work before we do any abortive or preventative medications.

## 2022-06-29 NOTE — Assessment & Plan Note (Signed)
Patient states she is to be on what she believes was Strattera in the past.  When she was younger this was in the childhood diagnosis.  Ambulatory referral to psychology for retesting.  If positive patient is doing telemedicine psychiatry

## 2022-06-29 NOTE — Assessment & Plan Note (Signed)
Given patient having abdominal discomfort and active outdoors with history of tick bites will check alpha gal

## 2022-06-29 NOTE — Assessment & Plan Note (Signed)
Ambiguous in nature check celiac panel along with alpha gal.  Patient started to rule out with H. pylori has tried PPIs and Carafate.

## 2022-06-29 NOTE — Assessment & Plan Note (Signed)
Patient having pitting to some of her nails at least 2-3 per side.  Check iron studies today

## 2022-06-29 NOTE — Assessment & Plan Note (Signed)
Currently followed by psychiatry versus a telemedicine service.  Patient is currently maintained on sertraline 150 mg.  Patient denies HI/SI/AVH.  PHQ-9 and GAD-7 administered in office.

## 2022-06-29 NOTE — Patient Instructions (Signed)
Nice to see you today I will be in touch with the labs once I have them I will be in touch with a follow up after that.  We can get you on the schedule for a physical in 6 months

## 2022-06-29 NOTE — Assessment & Plan Note (Signed)
Has had workup with GI and labs.  Ambiguous in nature.  Pending labs

## 2022-07-02 LAB — ALPHA-GAL PANEL
Allergen, Mutton, f88: <0.1 kU/L
Allergen, Pork, f26: <0.1 kU/L
Beef: <0.1 kU/L
CLASS: 0
CLASS: 0
Class: 0
GALACTOSE-ALPHA-1,3-GALACTOSE IGE*: <0.1 kU/L (ref <0.10)

## 2022-07-02 LAB — INTERPRETATION:

## 2022-07-07 LAB — CELIAC PNL 2 RFLX ENDOMYSIAL AB TTR
(tTG) Ab, IgA: <1 U/mL
(tTG) Ab, IgG: <1 U/mL
Endomysial Ab IgA: NEGATIVE
Gliadin IgA: <1 U/mL
Gliadin IgG: <1 U/mL
Immunoglobulin A: 188 mg/dL (ref 47–310)

## 2022-07-13 ENCOUNTER — Encounter: Payer: Self-pay | Admitting: Nurse Practitioner

## 2022-07-14 MED ORDER — ONDANSETRON 4 MG PO TBDP: 4.0000 mg | ORAL_TABLET | Freq: Three times a day (TID) | ORAL | 0 refills | Status: DC | PRN

## 2022-07-14 NOTE — Telephone Encounter (Signed)
Please see correspondence with patient. She is asking for Zofran.  Thanks!

## 2022-07-15 ENCOUNTER — Ambulatory Visit: Payer: Commercial Managed Care - PPO | Admitting: Nurse Practitioner

## 2022-07-15 ENCOUNTER — Ambulatory Visit (INDEPENDENT_AMBULATORY_CARE_PROVIDER_SITE_OTHER)
Admission: RE | Admit: 2022-07-15 | Discharge: 2022-07-15 | Disposition: A | Discharge status: Home / Self Care | Payer: Commercial Managed Care - PPO | Source: Ambulatory Visit | Attending: Nurse Practitioner | Admitting: Nurse Practitioner

## 2022-07-15 VITALS — BP 108/62 | HR 75 | Ht 63.0 in | Wt 148.0 lb

## 2022-07-15 DIAGNOSIS — M546 Pain in thoracic spine: Secondary | ICD-10-CM | POA: Insufficient documentation

## 2022-07-15 DIAGNOSIS — R1084 Generalized abdominal pain: Secondary | ICD-10-CM | POA: Insufficient documentation

## 2022-07-15 DIAGNOSIS — F419 Anxiety disorder, unspecified: Secondary | ICD-10-CM

## 2022-07-15 DIAGNOSIS — R0781 Pleurodynia: Secondary | ICD-10-CM

## 2022-07-15 DIAGNOSIS — R110 Nausea: Secondary | ICD-10-CM

## 2022-07-15 DIAGNOSIS — F32A Depression, unspecified: Secondary | ICD-10-CM

## 2022-07-15 DIAGNOSIS — R0789 Other chest pain: Secondary | ICD-10-CM | POA: Insufficient documentation

## 2022-07-15 HISTORY — DX: Generalized abdominal pain: R10.84

## 2022-07-15 HISTORY — DX: Pain in thoracic spine: M54.6

## 2022-07-15 MED ORDER — OMEPRAZOLE 20 MG PO CPDR: 20.0000 mg | DELAYED_RELEASE_CAPSULE | Freq: Every day | ORAL | 1 refills | Status: DC

## 2022-07-15 NOTE — Assessment & Plan Note (Signed)
Patient continue taking Zofran 4 mg ODT as needed.  She also just recently picked up Carafate take as prescribed from other provider.  Will add on omeprazole 20 mg for at least 1 month

## 2022-07-15 NOTE — Assessment & Plan Note (Signed)
States has been evaluated by PT at work states she has some muscle tightness and has been using kinetic tape is beneficial.  Patient request formal PT evaluation ambulatory referral placed

## 2022-07-15 NOTE — Assessment & Plan Note (Signed)
Patient has been seen in the emergency department with a CT scan.  She has had recent GI workup in November 2023 that included endoscopy and colonoscopy that came back normal.  She does have Bentyl, Zofran, Carafate and now omeprazole.  If she does not improve she can always see GI again or consider adding on duloxetine or Cymbalta

## 2022-07-15 NOTE — Telephone Encounter (Signed)
Called patient set up for office visit today at 3:20.

## 2022-07-15 NOTE — Assessment & Plan Note (Signed)
Left lower rib tenderness to palpation.  Will do left-sided rib film with chest inclusive continues an over-the-counter analgesics as needed and beneficial

## 2022-07-15 NOTE — Patient Instructions (Addendum)
Nice to see you today I will be in touch with the xray Start taking the omeprazole Let me know in 10 days if you are not improving

## 2022-07-15 NOTE — Progress Notes (Signed)
Acute Office Visit  Subjective:     Patient ID: Stefanie Parks, female    DOB: Nov 10, 1990, 32 y.o.   MRN: [id number]  Chief Complaint  Patient presents with   Emesis    Intermittent, every day but increases by afternoon; denies fever/chills; thinks is from heat/exertion   Fatigue    Worse in afternoons   Back Pain    Upper-mid back, less pain when spine is straightened forward, unable to wear bra, has older injury to left hip; also mentions her BLE goes numb, denies tingling     Patient is in today for Abdominal pain    States at work Tuesday and states that she was nausea and vomting bile. States that she was having the episode and then went to ed. States that she was given medicine. States that the PT at work states that was taping her  States that her thoracic spine pain and has been being taped and that has helped with the abdominal pain . She was seen in ED in the 07/07/2022 and had a work up inclusive of a ct scan. Per provider note it was normal. Went back to ed on 07/13/2022 and left before being seen   States that she was seen b GI and had a colonoscpy and endoscopy and that came back clean. [facility]  States that she has been using tylenol and ibuprofen and see is taking the tylenol. States that it is not helping. Maybe short lived. States that the zofran is helping. States that she has been able to eat and drink. States that if she did not have the zofran it would hurt her belly  States that she did start back on carafate. States that she has not taken carafate today. States that the bentyl is helping she is taking it 4 times a day   Review of Systems  Constitutional:  Negative for chills and fever.  Respiratory:  Negative for shortness of breath.   Cardiovascular:  Positive for chest pain (MSK).  Gastrointestinal:  Positive for vomiting.  Musculoskeletal:  Positive for back pain.        Objective:    BP 108/62   Pulse 75   Ht 5\' 3"  (1.6 m)   Wt 148  lb (67.1 kg)   SpO2 98%   BMI 26.22 kg/m    Physical Exam Vitals and nursing note reviewed.  Constitutional:      Appearance: Normal appearance.  Cardiovascular:     Rate and Rhythm: Normal rate and regular rhythm.     Heart sounds: Normal heart sounds.  Pulmonary:     Effort: Pulmonary effort is normal.     Breath sounds: Normal breath sounds.  Chest:     Chest wall: Tenderness present.  Abdominal:     General: Bowel sounds are normal. There is no distension.     Palpations: There is no mass.     Tenderness: There is abdominal tenderness.     Hernia: No hernia is present.    Musculoskeletal:        General: Tenderness present. No signs of injury.       Arms:     Comments: Left rib cage lower ttp  Neurological:     Mental Status: She is alert.     No results found for any visits on 07/15/22.      Assessment & Plan:   Problem List Items Addressed This Visit       Other   Anxiety and depression  Relevant Medications   sertraline (ZOLOFT) 100 MG tablet   Nausea    Patient continue taking Zofran 4 mg ODT as needed.  She also just recently picked up Carafate take as prescribed from other provider.  Will add on omeprazole 20 mg for at least 1 month      Thoracic spine pain    States has been evaluated by PT at work states she has some muscle tightness and has been using kinetic tape is beneficial.  Patient request formal PT evaluation ambulatory referral placed      Relevant Orders   Ambulatory referral to Physical Therapy   Generalized abdominal pain    Patient has been seen in the emergency department with a CT scan.  She has had recent GI workup in November 2023 that included endoscopy and colonoscopy that came back normal.  She does have Bentyl, Zofran, Carafate and now omeprazole.  If she does not improve she can always see GI again or consider adding on duloxetine or Cymbalta      Relevant Medications   omeprazole (PRILOSEC) 20 MG capsule   Rib pain -  Primary    Left lower rib tenderness to palpation.  Will do left-sided rib film with chest inclusive continues an over-the-counter analgesics as needed and beneficial      Relevant Orders   DG Ribs Unilateral W/Chest Left    Meds ordered this encounter  Medications   omeprazole (PRILOSEC) 20 MG capsule    Sig: Take 1 capsule (20 mg total) by mouth daily.    Dispense:  30 capsule    Refill:  1    Order Specific Question:   Supervising Provider    Answer:   TOWER, MARNE A [[id number]    Return if symptoms worsen or fail to improve.  Romilda Garret, NP

## 2022-07-16 ENCOUNTER — Encounter: Payer: Self-pay | Admitting: Nurse Practitioner

## 2022-07-16 DIAGNOSIS — R1084 Generalized abdominal pain: Secondary | ICD-10-CM

## 2022-07-26 ENCOUNTER — Telehealth: Payer: Self-pay

## 2022-07-26 NOTE — Transitions of Care (Post Inpatient/ED Visit) (Signed)
   07/26/2022  Name: Klani Caridi MRN: [id number] DOB: 07-04-1990  Today's TOC FU Call Status: Today's TOC FU Call Status:: Successful TOC FU Call Competed TOC FU Call Complete Date: 07/26/22  Transition Care Management Follow-up Telephone Call Date of Discharge: 07/23/22 Discharge Facility: Other Facility Name of Other Discharge Facility: [facility] Type of Discharge: Emergency Department Reason for ED Visit: Other: How have you been since you were released from the hospital?: Worse Any questions or concerns?: No  Items Reviewed: Did you receive and understand the discharge instructions provided?: Yes Medications obtained and verified?: Yes (Medications Reviewed) Any new allergies since your discharge?: No Dietary orders reviewed?: NA Do you have support at home?: Yes  Home Care and Equipment/Supplies: [facility] Ordered?: NA Any new equipment or medical supplies ordered?: NA  Functional Questionnaire: Do you need assistance with bathing/showering or dressing?: No Do you need assistance with meal preparation?: No Do you need assistance with eating?: No Do you have difficulty maintaining continence: No Do you need assistance with getting out of bed/getting out of a chair/moving?: No Do you have difficulty managing or taking your medications?: No  Folllow up appointments reviewed: PCP Follow-up appointment confirmed?: Yes Date of PCP follow-up appointment?: 07/28/22 Follow-up Provider: Karl Ito NP [facility] Follow-up appointment confirmed?: NA Do you need transportation to your follow-up appointment?: No Do you understand care options if your condition(s) worsen?: Yes-patient verbalized understanding    Amo LPN [place] Direct Dial [phone]

## 2022-07-28 ENCOUNTER — Encounter: Payer: Self-pay | Admitting: Nurse Practitioner

## 2022-07-28 ENCOUNTER — Ambulatory Visit: Payer: Commercial Managed Care - PPO | Admitting: Nurse Practitioner

## 2022-07-28 VITALS — BP 132/84 | HR 88 | Temp 97.9°F | Ht 63.0 in | Wt 151.4 lb

## 2022-07-28 DIAGNOSIS — M545 Low back pain, unspecified: Secondary | ICD-10-CM | POA: Insufficient documentation

## 2022-07-28 DIAGNOSIS — R202 Paresthesia of skin: Secondary | ICD-10-CM

## 2022-07-28 DIAGNOSIS — R1084 Generalized abdominal pain: Secondary | ICD-10-CM

## 2022-07-28 DIAGNOSIS — M6289 Other specified disorders of muscle: Secondary | ICD-10-CM | POA: Diagnosis not present

## 2022-07-28 DIAGNOSIS — M546 Pain in thoracic spine: Secondary | ICD-10-CM

## 2022-07-28 HISTORY — DX: Low back pain, unspecified: M54.50

## 2022-07-28 HISTORY — DX: Other specified disorders of muscle: M62.89

## 2022-07-28 HISTORY — DX: Paresthesia of skin: R20.2

## 2022-07-28 MED ORDER — PREDNISONE 20 MG PO TABS: ORAL_TABLET | ORAL | 0 refills | Status: AC

## 2022-07-28 NOTE — Patient Instructions (Signed)
Nice to see you today Lets try a course of steroids.  Follow up if you do not improve. Drop off the papers or fax them to me

## 2022-07-28 NOTE — Assessment & Plan Note (Signed)
Bilateral lower extremities.  We will get her through this acute.  If she still has some these concerning symptoms we will need to do further workup in regards to may be MS diagnosis.  Patient does not know her true family history as she is adopted

## 2022-07-28 NOTE — Progress Notes (Signed)
Established Patient Office Visit  Subjective   Patient ID: Stefanie Parks, female    DOB: 1991-04-10  Age: 32 y.o. MRN: [id number]  Chief Complaint  Patient presents with   Gastroesophageal Reflux   Back Pain    FU from ED Left quadrant pain x 1week Bending & movement makes Pain worse      ED follow up: Patient was seen at [facility] the number of the emergency department 07/23/2022 for abdominal pain and back pain.  Workup was reassuring for no I did give patient Zofran and a GI cocktail which improved the abdominal pain.  She was informed to continue taking her GI medications and was given a prescription for viscous lidocaine to add in as needed for extreme abdominal pain and to follow-up with GI.  Did refer to orthopedist to do with her back pain.  Patient has started physical therapy per their note and it has helped albeit modestly. Patient did have a CT abdomen pelvis with contrast on 07/07/2022 it did show a small cystic structure adjacent to the rectum which appears contiguous with the intraperitoneal cavity in the pelvis and appears decreased in size compared to prior CT in November 2009 it could represent a small peritoneal inclusion cyst.   States that the back pain is worse. States that when she was washing dishhes her muscles tightened up. States that she canno teven wear a bra currently because of the pain  If she lays on her back ona dright side she feels ok. States that standing and bending makes it worse. States no injury or trauma  Patient was referred to orthopedist from the emergency department.  She was followed by [facility] gastroenterology states she had endoscopy and colonoscopy that came back negative.  Patient does not want to go back to the GI provider.  She does mention that she has altered sensation to bilateral lower extremities knees below but this has been going on for years.  She thought it was due to secondary to her knee surgery on the right side but it is bilateral  lower extremities.  Patient also has a band type feeling around her abdomen feels like a bra but she is not wearing one.  Patient was adopted and does not know true family history.  States she did have an MRI of her brain that was normal within the past year.  No MRI of cervical spine.    07/28/2022    2:38 PM 06/29/2022    9:17 AM  PHQ9 SCORE ONLY  PHQ-9 Total Score 11 13       07/28/2022    2:40 PM 06/29/2022    9:18 AM  GAD 7 : Generalized Anxiety Score  Nervous, Anxious, on Edge 0 1  Control/stop worrying 2 1  Worry too much - different things 0 1  Trouble relaxing 3 1  Restless 3 1  Easily annoyed or irritable 2 1  Afraid - awful might happen 2 1  Total GAD 7 Score 12 7  Anxiety Difficulty Extremely difficult Somewhat difficult        Review of Systems  Constitutional:  Negative for chills and fever.  Gastrointestinal:  Positive for abdominal pain. Negative for nausea and vomiting.  Musculoskeletal:  Positive for back pain.  Neurological:  Positive for tingling. Negative for weakness.      Objective:     BP 132/84   Pulse 88   Temp 97.9 F (36.6 C) (Oral)   Ht 5' 3"$  (1.6 m)  Wt 151 lb 6.4 oz (68.7 kg)   LMP  (LMP Unknown)   SpO2 99%   BMI 26.82 kg/m    Physical Exam Vitals and nursing note reviewed.  Constitutional:      Appearance: Normal appearance.  Cardiovascular:     Rate and Rhythm: Normal rate and regular rhythm.     Heart sounds: Normal heart sounds.  Pulmonary:     Effort: Pulmonary effort is normal.     Breath sounds: Normal breath sounds.  Abdominal:     General: Bowel sounds are normal. There is no distension.     Palpations: There is no mass.     Tenderness: There is abdominal tenderness.     Hernia: No hernia is present.  Musculoskeletal:        General: Tenderness present.     Thoracic back: Tenderness present.     Lumbar back: Tenderness present. Positive left straight leg raise test. Negative right straight leg raise test.   Neurological:     Mental Status: She is alert.      No results found for any visits on 07/28/22.    The ASCVD Risk score (Arnett DK, et al., 2019) failed to calculate for the following reasons:   The 2019 ASCVD risk score is only valid for ages 67 to 67    Assessment & Plan:   Problem List Items Addressed This Visit       Other   Thoracic spine pain   Relevant Medications   predniSONE (DELTASONE) 20 MG tablet   Generalized abdominal pain - Primary    Patient has been seen by the emergency department several times me at least once has been evaluated by GI in the past.  Patient is on Carafate along with PPI and viscous lidocaine.  NSAIDs do not seem to make the pain worse and will put patient on prednisone for other ailments did warn her that her abdomen worse ambulatory referral to GI for further follow-up and workup.  Did review recent CT scan on 07/07/2022      Relevant Orders   Ambulatory referral to Gastroenterology   Lumbar pain    Patient is experiencing thoracic and lumbar spine pain has not been evaluated by physical therapy at this juncture.  Was in the ER they did not image.  Will do a burst of prednisone to see if this will help some of her elements.  Follow-up with orthopedist as scheduled once contacted.  Did give the patient information in regards to a more urgent Ortho walk-in urgent care      Relevant Medications   predniSONE (DELTASONE) 20 MG tablet   Paresthesia    Bilateral lower extremities.  We will get her through this acute.  If she still has some these concerning symptoms we will need to do further workup in regards to may be MS diagnosis.  Patient does not know her true family history as she is adopted      Relevant Medications   predniSONE (DELTASONE) 20 MG tablet   Muscle tightness    States she has some muscle tightness when being palpated on the spine.      Relevant Medications   predniSONE (DELTASONE) 20 MG tablet    Return if symptoms  worsen or fail to improve.    Romilda Garret, NP

## 2022-07-28 NOTE — Assessment & Plan Note (Signed)
Patient has been seen by the emergency department several times me at least once has been evaluated by GI in the past.  Patient is on Carafate along with PPI and viscous lidocaine.  NSAIDs do not seem to make the pain worse and will put patient on prednisone for other ailments did warn her that her abdomen worse ambulatory referral to GI for further follow-up and workup.  Did review recent CT scan on 07/07/2022

## 2022-07-28 NOTE — Assessment & Plan Note (Signed)
States she has some muscle tightness when being palpated on the spine.

## 2022-07-28 NOTE — Assessment & Plan Note (Signed)
Patient is experiencing thoracic and lumbar spine pain has not been evaluated by physical therapy at this juncture.  Was in the ER they did not image.  Will do a burst of prednisone to see if this will help some of her elements.  Follow-up with orthopedist as scheduled once contacted.  Did give the patient information in regards to a more urgent Ortho walk-in urgent care

## 2022-07-31 ENCOUNTER — Encounter: Payer: Self-pay | Admitting: Nurse Practitioner

## 2022-08-04 ENCOUNTER — Encounter: Payer: Self-pay | Admitting: Nurse Practitioner

## 2022-08-05 ENCOUNTER — Encounter: Payer: Self-pay | Admitting: Nurse Practitioner

## 2022-08-06 ENCOUNTER — Encounter: Payer: Self-pay | Admitting: Nurse Practitioner

## 2022-08-06 DIAGNOSIS — M2550 Pain in unspecified joint: Secondary | ICD-10-CM

## 2022-08-06 NOTE — Telephone Encounter (Signed)
Patient called in to follow up on this request. Please advise. Thank you!

## 2022-08-09 NOTE — Telephone Encounter (Signed)
Can we call and see who referred her to Rheumatologist as it was not me. She may need to contact the referring provider

## 2022-08-09 NOTE — Telephone Encounter (Signed)
I called pt and she said it was the ED and she has called them. She said that y'all talked about getting her tested for some stuff as well.

## 2022-08-09 NOTE — Telephone Encounter (Signed)
Pt called stating she got referred to a rheumatologist & was told by the office that the referral wasn't specific enough. Pt is asking can [facility] send another referral out so she can be seen? Call back # [phone]

## 2022-08-09 NOTE — Telephone Encounter (Signed)
I am ok putting in an order of an ANA. She got the other things done in the ED. Lab ordered placed

## 2022-08-10 ENCOUNTER — Encounter: Payer: Self-pay | Admitting: Nurse Practitioner

## 2022-08-12 ENCOUNTER — Telehealth: Payer: Self-pay | Admitting: Nurse Practitioner

## 2022-08-12 ENCOUNTER — Encounter: Payer: Self-pay | Admitting: Nurse Practitioner

## 2022-08-12 ENCOUNTER — Other Ambulatory Visit (INDEPENDENT_AMBULATORY_CARE_PROVIDER_SITE_OTHER): Payer: Commercial Managed Care - PPO

## 2022-08-12 DIAGNOSIS — M2550 Pain in unspecified joint: Secondary | ICD-10-CM

## 2022-08-12 NOTE — Telephone Encounter (Signed)
Patient emailed over [facility] paperwork that needs to be completed by March 4. Placed in Matt's box up front. Thank you!

## 2022-08-13 DIAGNOSIS — Z0279 Encounter for issue of other medical certificate: Secondary | ICD-10-CM

## 2022-08-13 LAB — ANA W/REFLEX: Anti Nuclear Antibody (ANA): NEGATIVE

## 2022-08-13 NOTE — Telephone Encounter (Signed)
We did  °

## 2022-08-13 NOTE — Telephone Encounter (Signed)
Called and Informed pt of this information.

## 2022-08-13 NOTE — Telephone Encounter (Signed)
Paperwork is in the email but giving error when trying to print it out. Sent it over to [place] and she will try to print it out.

## 2022-08-13 NOTE — Telephone Encounter (Signed)
We received FMLA paperwork.  This is due on 08/16/22.  This is for abdominal pain, back and neck pain, migraines, loss of feeling in lower legs, "shock" sensation in chest.  Intermittent leave beginning on 08/05/22, requesting this for a 6 month period.  She has been referred to gastro, rheum.  No inpatient care.  Next appt with you on 08/23/22.  Paperwork placed in your inbox for completion and signing.  When paperwork is complete, call patient to pick up her copy.

## 2022-08-13 NOTE — Telephone Encounter (Signed)
Pt called checking on status of ppw? Call back # [phone]

## 2022-08-13 NOTE — Telephone Encounter (Signed)
Paperwork is complete and in my outbox for faxing and scanning

## 2022-08-16 ENCOUNTER — Encounter: Payer: Self-pay | Admitting: Nurse Practitioner

## 2022-08-16 DIAGNOSIS — R202 Paresthesia of skin: Secondary | ICD-10-CM

## 2022-08-16 NOTE — Telephone Encounter (Signed)
Patient's completed form has been faxed to fax# [phone].  Received fax confirmation.  Copies made for billing, scanning, and myself.  Patient got her copy while in the office signing her form.  Sent msg via mychart that this has been faxed.

## 2022-08-16 NOTE — Telephone Encounter (Signed)
Called pt and notified that paperwork was ready and will be faxed.

## 2022-08-16 NOTE — Telephone Encounter (Signed)
Patient is coming in today to sign her portion of the FMLA form.  I have the form at my desk in a green folder.  Patient needs copy of the completed form and I will need to fax the completed form once she signs her portion.

## 2022-08-17 NOTE — Telephone Encounter (Addendum)
Formed placed in box for question 7 to be filled out. Number 8 question needs duration and frequency.

## 2022-08-17 NOTE — Telephone Encounter (Signed)
Stefanie Parks [phone]  Question 7 & 8 are blank, need info to evaluate  Paperwork due back by March 18th

## 2022-08-19 ENCOUNTER — Ambulatory Visit: Payer: Commercial Managed Care - PPO | Admitting: Nurse Practitioner

## 2022-08-23 ENCOUNTER — Ambulatory Visit: Payer: Commercial Managed Care - PPO | Admitting: Nurse Practitioner

## 2022-08-24 ENCOUNTER — Telehealth: Payer: Self-pay | Admitting: Nurse Practitioner

## 2022-08-24 NOTE — Telephone Encounter (Signed)
Form completed for FMLA and in box to be faxed

## 2022-08-24 NOTE — Telephone Encounter (Signed)
FMLA Formed copied, faxed and placed in Amy box.

## 2022-09-22 ENCOUNTER — Other Ambulatory Visit: Payer: Self-pay | Admitting: Neurology

## 2022-09-22 DIAGNOSIS — R42 Dizziness and giddiness: Secondary | ICD-10-CM

## 2022-09-22 DIAGNOSIS — R200 Anesthesia of skin: Secondary | ICD-10-CM

## 2022-09-22 DIAGNOSIS — G379 Demyelinating disease of central nervous system, unspecified: Secondary | ICD-10-CM

## 2022-09-23 ENCOUNTER — Other Ambulatory Visit: Payer: Self-pay | Admitting: Nurse Practitioner

## 2022-09-24 MED ORDER — ONDANSETRON 4 MG PO TBDP: 4.0000 mg | ORAL_TABLET | Freq: Three times a day (TID) | ORAL | 0 refills | Status: DC | PRN

## 2022-09-24 MED ORDER — DICYCLOMINE HCL 20 MG PO TABS: 20.0000 mg | ORAL_TABLET | Freq: Four times a day (QID) | ORAL | 0 refills | Status: DC

## 2022-09-28 ENCOUNTER — Other Ambulatory Visit: Payer: Commercial Managed Care - PPO

## 2022-10-01 ENCOUNTER — Ambulatory Visit: Payer: Commercial Managed Care - PPO | Admitting: Psychology

## 2022-10-05 ENCOUNTER — Other Ambulatory Visit: Payer: Commercial Managed Care - PPO

## 2022-10-06 ENCOUNTER — Ambulatory Visit: Payer: Commercial Managed Care - PPO | Admitting: Psychology

## 2022-10-06 NOTE — Progress Notes (Signed)
This writer contacted the patient via telephone to check-in as they did not present for the appointment. The patient was unable to be reached. A HIPAA-compliant voicemail was left noting this writer would stay on the appointment until 15 minutes after the scheduled appointment time, but after this time, the appointment would need to be rescheduled.                Fadia Marlar T Shaakira Borrero, PsyD 

## 2022-10-18 ENCOUNTER — Encounter: Payer: Self-pay | Admitting: Neurology

## 2022-10-19 ENCOUNTER — Ambulatory Visit
Admission: RE | Admit: 2022-10-19 | Discharge: 2022-10-19 | Disposition: A | Discharge status: Home / Self Care | Payer: Commercial Managed Care - PPO | Source: Ambulatory Visit | Attending: Neurology | Admitting: Neurology

## 2022-10-19 DIAGNOSIS — R42 Dizziness and giddiness: Secondary | ICD-10-CM

## 2022-10-19 DIAGNOSIS — R200 Anesthesia of skin: Secondary | ICD-10-CM

## 2022-10-19 DIAGNOSIS — G379 Demyelinating disease of central nervous system, unspecified: Secondary | ICD-10-CM

## 2022-10-19 MED ORDER — GADOPICLENOL 0.5 MMOL/ML IV SOLN
7.5000 mL | Freq: Once | INTRAVENOUS | Status: AC | PRN
  Administered 2022-10-19: 7.5 mL via INTRAVENOUS

## 2022-10-25 ENCOUNTER — Telehealth: Payer: Self-pay

## 2022-10-25 NOTE — Transitions of Care (Post Inpatient/ED Visit) (Signed)
   10/25/2022  Name: Stefanie Parks MRN: [id number] DOB: [birth year]  Today's TOC FU Call Status: Today's TOC FU Call Status:: Unsuccessul Call (1st Attempt) Unsuccessful Call (1st Attempt) Date: 10/25/22  Attempted to reach the patient regarding the most recent Inpatient/ED visit.  Follow Up Plan: Additional outreach attempts will be made to reach the patient to complete the Transitions of Care (Post Inpatient/ED visit) call.   Signature Elisha Ponder LPN [facility] AWV Team Direct dial:  [phone]

## 2022-10-29 ENCOUNTER — Other Ambulatory Visit: Payer: Self-pay | Admitting: Nurse Practitioner

## 2022-10-29 MED ORDER — ONDANSETRON 4 MG PO TBDP: 4.0000 mg | ORAL_TABLET | Freq: Three times a day (TID) | ORAL | 0 refills | Status: DC | PRN

## 2022-11-02 ENCOUNTER — Encounter: Payer: Self-pay | Admitting: Gastroenterology

## 2022-11-02 ENCOUNTER — Ambulatory Visit (INDEPENDENT_AMBULATORY_CARE_PROVIDER_SITE_OTHER): Payer: Commercial Managed Care - PPO | Admitting: Gastroenterology

## 2022-11-02 VITALS — BP 105/70 | HR 62 | Temp 98.1°F | Ht 63.0 in | Wt 159.8 lb

## 2022-11-02 DIAGNOSIS — M7918 Myalgia, other site: Secondary | ICD-10-CM

## 2022-11-02 NOTE — Progress Notes (Signed)
Wyline Mood MD, MRCP(U.K) [address]  [address]  [place], [place] [address]  Main: [phone]  Fax: [phone]   Gastroenterology Consultation  Referring Provider:     Eden Emms, NP Primary Care Physician:  Eden Emms, NP Primary Gastroenterologist:  Dr. Wyline Mood  Reason for Consultation:    Aminal pain        HPI:   Stefanie Parks is a 32 y.o. y/o female referred for consultation & management  by Eden Emms, NP.    She was seen in the emergency room at [facility] ER in February 2024 for abdominal pain chest pain.  Has been going on for over 2 years in the ER she mention she had had normal endoscopy and colonoscopy was followed by gastro and surgery elsewhere review of care everywhere I do not find any past endoscopy reports..  It appears the patient has had multiple ER visits.   She discussed the pain in the left upper quadrant originating in her back and radiating to the front like a sharp knife worse during certain positions ongoing for more than a year she has had some back evaluation and she is due to see an orthopedic surgeon for possible nerve compression in her back.  Not related to food intake not related to bowel habits.  She states she has had prior EGD and colonoscopy at [facility] gastroenterologist and was told she has IBS.  07/07/2022 CT abdomen and pelvis showed no acute abnormalities She has been referred for abdominal pain.  No prior abdominal. 10/22/2022 hemoglobin 13.4 g, urine analysis showed moderate leukocyte esterase bacteria and mucus pregnancy test negative, lipase normal, CMP normal.  Past Medical History:  Diagnosis Date   Concussion    x2   Migraines     Past Surgical History:  Procedure Laterality Date   ANTERIOR CRUCIATE LIGAMENT REPAIR     right    Prior to Admission medications   Medication Sig Start Date End Date Taking? Authorizing Provider  dicyclomine (BENTYL) 20 MG tablet Take 1 tablet (20 mg total) by mouth every 6  (six) hours. 09/24/22   Eden Emms, NP  ibuprofen (ADVIL) 600 MG tablet Take 1 tablet (600 mg total) by mouth every 6 (six) hours as needed. 12/13/18   Domenick Gong, MD  levonorgestrel-ethinyl estradiol Erenest Rasher) 0.15-30 MG-MCG tablet Take 1 tablet by mouth daily.    [provider]  meclizine (ANTIVERT) 25 MG tablet Take 25 mg by mouth 3 (three) times daily as needed for dizziness.    [provider]  omeprazole (PRILOSEC) 20 MG capsule Take 1 capsule (20 mg total) by mouth daily. 07/15/22   Eden Emms, NP  ondansetron (ZOFRAN-ODT) 4 MG disintegrating tablet Take 1 tablet (4 mg total) by mouth every 8 (eight) hours as needed for nausea or vomiting. 10/29/22   Eden Emms, NP  sertraline (ZOLOFT) 100 MG tablet Take 200 mg by mouth daily. 06/25/22   [provider]  sucralfate (CARAFATE) 1 g tablet Take 1 g by mouth 4 (four) times daily -  with meals and at bedtime. 07/07/22 07/28/22  [provider]    Family History  Adopted: Yes     Social History   Tobacco Use   Smoking status: Former    Types: Cigarettes   Smokeless tobacco: Never  Vaping Use   Vaping Use: Every day   Substances: Nicotine, Flavoring  Substance Use Topics   Alcohol use: Not Currently  Drug use: No    Comment: delta 8    Allergies as of 11/02/2022 - Review Complete 11/02/2022  Allergen Reaction Noted   Nickel  04/05/2013    Review of Systems:    All systems reviewed and negative except where noted in HPI.   Physical Exam:  BP 105/70   Pulse 62   Temp 98.1 F (36.7 C) (Oral)   Ht 5\' 3"  (1.6 m)   Wt 159 lb 12.8 oz (72.5 kg)   BMI 28.31 kg/m  No LMP recorded. (Menstrual status: Irregular Periods). Psych:  Alert and cooperative. Normal mood and affect. General:   Alert,  Well-developed, well-nourished, pleasant and cooperative in NAD Head:  Normocephalic and atraumatic. Eyes:  Sclera clear, no icterus.   Conjunctiva pink. Ears:  Normal auditory  acuity. Neck:  Supple; no masses or thyromegaly. Tenderness over the left lower ribs posteriorly in the mid axillary line and midclavicular line along the same rib replicating the pain worse when she bends towards the left-hand side of the hip Abdomen:  Normal bowel sounds.  No bruits.  Soft, non-tender and non-distended without masses, hepatosplenomegaly or hernias noted.  No guarding or rebound tenderness.    Neurologic:  Alert and oriented x3;  grossly normal neurologically. Psych:  Alert and cooperative. Normal mood and affect.  Imaging Studies: MR BRAIN W WO CONTRAST  Result Date: 10/19/2022 CLINICAL DATA:  Dizziness and giddiness. Back pain radiating to the legs with numbness. Headache, nausea, and involuntary movements. EXAM: MRI HEAD WITHOUT AND WITH CONTRAST TECHNIQUE: Multiplanar, multiecho pulse sequences of the brain and surrounding structures were obtained without and with intravenous contrast. CONTRAST:  7.5 mL Vueway COMPARISON:  None Available. FINDINGS: Brain: There is no evidence of an acute infarct, intracranial hemorrhage, mass, midline shift, or extra-axial fluid collection. The ventricles and sulci are normal. The cerebellar tonsils are normally positioned. The brain is normal in signal, and no abnormal enhancement is identified. Vascular: Major intracranial vascular flow voids are preserved. Skull and upper cervical spine: Unremarkable bone marrow signal. Sinuses/Orbits: Unremarkable orbits. Clear paranasal sinuses. Trace right mastoid fluid. Other: None. IMPRESSION: Unremarkable appearance of the brain. Electronically Signed   By: Sebastian Ache M.D.   On: 10/19/2022 13:49    Assessment and Plan:   Stefanie Parks is a 32 y.o. y/o female has been referred for abdominal pain.  History and physical examination is consistent with musculoskeletal pain related to her back.  Patient information provided she should follow-up with physical therapy primary care provider and orthopedics.   Will check her H. pylori status today.  She does smoke marijuana and advised to stop doing so as it can cause nausea vomiting and reflux.  Follow up in as needed  Dr Wyline Mood MD,MRCP(U.K)

## 2022-11-02 NOTE — Patient Instructions (Signed)

## 2022-11-02 NOTE — Addendum Note (Signed)
Addended by: Adela Ports on: 11/02/2022 01:44 PM   Modules accepted: Orders

## 2022-11-04 LAB — H. PYLORI BREATH TEST: H pylori Breath Test: NEGATIVE

## 2022-11-22 ENCOUNTER — Other Ambulatory Visit: Payer: Self-pay | Admitting: Nurse Practitioner

## 2022-11-22 NOTE — Telephone Encounter (Signed)
Can we verify that she takes 2 100mg  tablets (a total of 200mg  daily)? I will bridge her for a month once we get the information

## 2022-11-22 NOTE — Telephone Encounter (Signed)
I have spoke to patient. She is taking 2 100mg  total of 200mg  daily. She has appointment with psychiatry first part  of July

## 2022-11-23 ENCOUNTER — Other Ambulatory Visit: Payer: Self-pay

## 2022-11-23 MED ORDER — SERTRALINE HCL 100 MG PO TABS
200.0000 mg | ORAL_TABLET | Freq: Every day | ORAL | 0 refills | Status: DC
  Filled 2022-11-23: qty 60, 30d supply, fill #0

## 2022-11-24 ENCOUNTER — Other Ambulatory Visit: Payer: Self-pay

## 2022-12-06 ENCOUNTER — Telehealth: Payer: Self-pay

## 2022-12-06 ENCOUNTER — Encounter: Payer: Self-pay | Admitting: Nurse Practitioner

## 2022-12-06 ENCOUNTER — Telehealth: Payer: Self-pay | Admitting: Nurse Practitioner

## 2022-12-06 DIAGNOSIS — I871 Compression of vein: Secondary | ICD-10-CM

## 2022-12-06 NOTE — Transitions of Care (Post Inpatient/ED Visit) (Signed)
12/06/2022  Name: Stefanie Parks MRN: [id number] DOB: 10-30-1990  Today's TOC FU Call Status: Today's TOC FU Call Status:: Successful TOC FU Call Competed TOC FU Call Complete Date: 12/06/22  Transition Care Management Follow-up Telephone Call Date of Discharge: 12/03/22 Discharge Facility: Other [facility]) Name of Other (Non[facility]) Discharge Facility: [facility] Type of Discharge: Emergency Department Reason for ED Visit: Other: (Left flank pain) How have you been since you were released from the hospital?: Better (pain is better with hydrocodone) Any questions or concerns?: No  Items Reviewed: Did you receive and understand the discharge instructions provided?: Yes Medications obtained,verified, and reconciled?: Yes (Medications Reviewed) Any new allergies since your discharge?: No Dietary orders reviewed?: Yes Do you have support at home?: Yes  Medications Reviewed Today: Medications Reviewed Today     Reviewed by Merleen Nicely, LPN (Licensed Practical Nurse) on 12/06/22 at 1342  Med List Status: <None>   Medication Order Taking? Sig Documenting Provider Last Dose Status Informant  dicyclomine (BENTYL) 20 MG tablet [id number] Yes Take 1 tablet (20 mg total) by mouth every 6 (six) hours. Eden Emms, NP Taking Active   gabapentin (NEURONTIN) 300 MG capsule [id number] Yes Take 300 mg by mouth 2 (two) times daily. [provider] Taking Active   HYDROcodone-acetaminophen (NORCO/VICODIN) 5-325 MG tablet [id number] Yes Take 1 tablet by mouth every 6 (six) hours as needed. [provider] Taking Active   ibuprofen (ADVIL) 600 MG tablet [id number] Yes Take 1 tablet (600 mg total) by mouth every 6 (six) hours as needed. Domenick Gong, MD Taking Active   levonorgestrel-ethinyl estradiol Erenest Rasher) 0.15-30 MG-MCG tablet [id number] Yes Take 1 tablet by mouth daily. [provider] Taking Active   meclizine (ANTIVERT) 25 MG tablet [id number]  Yes Take 25 mg by mouth 3 (three) times daily as needed for dizziness. [provider] Taking Active   omeprazole (PRILOSEC) 20 MG capsule [id number] Yes Take 1 capsule (20 mg total) by mouth daily. Eden Emms, NP Taking Active   ondansetron (ZOFRAN-ODT) 4 MG disintegrating tablet [id number] Yes Take 1 tablet (4 mg total) by mouth every 8 (eight) hours as needed for nausea or vomiting. Eden Emms, NP Taking Active   sertraline (ZOLOFT) 100 MG tablet [id number] Yes Take 2 tablets (200 mg total) by mouth daily. Eden Emms, NP Taking Active   sucralfate (CARAFATE) 1 g tablet [id number] No Take 1 g by mouth 4 (four) times daily -  with meals and at bedtime.  Patient not taking: Reported on 11/02/2022   [provider] Not Taking Expired 07/28/22 2359   sulfamethoxazole-trimethoprim (BACTRIM DS) 800-160 MG tablet [id number] Yes Take 1 tablet by mouth 2 (two) times daily. [provider] Taking Active             Home Care and Equipment/Supplies: Were Home Health Services Ordered?: NA Any new equipment or medical supplies ordered?: NA  Functional Questionnaire: Do you need assistance with bathing/showering or dressing?: No Do you need assistance with meal preparation?: No Do you need assistance with eating?: No Do you have difficulty maintaining continence: No Do you need assistance with getting out of bed/getting out of a chair/moving?: No Do you have difficulty managing or taking your medications?: No  Follow up appointments reviewed: PCP Follow-up appointment confirmed?: Yes Date of PCP follow-up appointment?: 12/08/22 Follow-up Provider: Mordecai Maes NP [facility] Follow-up appointment confirmed?: No Do you understand care options if your condition(s) worsen?: Yes-patient verbalized understanding  SIGNATURE  Woodfin Ganja LPN [facility] Nurse Health Advisor Direct Dial [phone]

## 2022-12-06 NOTE — Telephone Encounter (Signed)
Called patient she has been scheduled for visit in office. No further action needed.

## 2022-12-06 NOTE — Telephone Encounter (Signed)
I would like to see her in office

## 2022-12-06 NOTE — Telephone Encounter (Signed)
Patient contacted the office regarding recent ER visit, states she was referred by provider at hospital to a urologist due to diagnosis of nutcracker syndrome. Asked if there was any way Susy Frizzle could place a referral for her to be seen, possibly in [place] area. Patient states she is having some pain still and asked if Susy Frizzle could maybe prescribe her a medication to manage pain, offered patient follow up appointment from ER visit with Susy Frizzle so he could address pain she is having. Patient said she is unsure of what to do and asked if I would get a message to [facility]. Advised patient I would send message regarding referral and would ask about medication, however Susy Frizzle may ask to see her in office. Please advise, thank you.

## 2022-12-08 ENCOUNTER — Ambulatory Visit (INDEPENDENT_AMBULATORY_CARE_PROVIDER_SITE_OTHER): Payer: Commercial Managed Care - PPO | Admitting: Nurse Practitioner

## 2022-12-08 ENCOUNTER — Encounter: Payer: Self-pay | Admitting: Nurse Practitioner

## 2022-12-08 VITALS — BP 118/64 | HR 69 | Temp 98.2°F | Ht 63.0 in | Wt 156.0 lb

## 2022-12-08 DIAGNOSIS — Z09 Encounter for follow-up examination after completed treatment for conditions other than malignant neoplasm: Secondary | ICD-10-CM | POA: Insufficient documentation

## 2022-12-08 DIAGNOSIS — I871 Compression of vein: Secondary | ICD-10-CM | POA: Diagnosis not present

## 2022-12-08 MED ORDER — HYDROCODONE-ACETAMINOPHEN 5-325 MG PO TABS: 1.0000 | ORAL_TABLET | Freq: Three times a day (TID) | ORAL | 0 refills | Status: AC | PRN

## 2022-12-08 NOTE — Assessment & Plan Note (Signed)
Patient was seen in the emergency department on 12/03/2022.  Did review emergency department note along with labs and imaging

## 2022-12-08 NOTE — Patient Instructions (Signed)
Nice to see you today I want to see you in approx 6 months for your physical and labs Let me know about the referrals and if you are unable to get into the specialist

## 2022-12-08 NOTE — Assessment & Plan Note (Signed)
Patient was seen at [facility] emergency department a CT scan was obtained that showed nutcracker syndrome in the appropriate setting.  Patient was seen by her neurologist yesterday and urgent referral to vein vascular and neurology was placed.  Patient is still having pain but the hydrocodone does help she use the medication sparingly will give her a short course to get her through until she sees the specialist.  Sedation precautions and constipation precautions reviewed in regards to medication use

## 2022-12-08 NOTE — Progress Notes (Signed)
Established Patient Office Visit  Subjective   Patient ID: Stefanie Parks, female    DOB: 1990-07-23  Age: 32 y.o. MRN: [id number]  Chief Complaint  Patient presents with   Flank Pain    ED f/u TCM Call done 6/24      Hospital follow up: patient was seen in the ed on 12/03/2022 at [facility].  Patient underwent a CT abdomen pelvis with contrast.  This showed focal severe narrowing of the left renal vein as it passes between the SMA and aorta with mild upstream dilatation.  Patient was diagnosed with nutcracker syndrome and referred to urology. Patient was seen at neurology yesterday with a stat referral to [facility] vascular and urology in regards to nutcracker syndrome  Since 11/27/2022 she had a flare up. The pain is constant and left sided. States that it was increasing in intensity. She was seen on 12/03/2022 at [facility]. States that she was given some hydrocodone and finished her last one last night. States that she does have a muscle relaxer that helps but this is a different pain     Review of Systems  Constitutional:  Negative for chills and fever.  Genitourinary:  Positive for flank pain and frequency.       Straining with urination       Objective:     BP 118/64   Pulse 69   Temp 98.2 F (36.8 C) (Temporal)   Ht 5\' 3"  (1.6 m)   Wt 156 lb (70.8 kg)   SpO2 98%   BMI 27.63 kg/m  BP Readings from Last 3 Encounters:  12/08/22 118/64  11/02/22 105/70  07/28/22 132/84   Wt Readings from Last 3 Encounters:  12/08/22 156 lb (70.8 kg)  11/02/22 159 lb 12.8 oz (72.5 kg)  07/28/22 151 lb 6.4 oz (68.7 kg)      Physical Exam Vitals and nursing note reviewed.  Constitutional:      Appearance: Normal appearance.  Cardiovascular:     Rate and Rhythm: Normal rate and regular rhythm.     Heart sounds: Normal heart sounds.  Pulmonary:     Effort: Pulmonary effort is normal.     Breath sounds: Normal breath sounds.  Abdominal:     General: Bowel  sounds are normal. There is no distension.     Palpations: There is no mass.     Tenderness: There is abdominal tenderness. There is left CVA tenderness. There is no right CVA tenderness.     Hernia: No hernia is present.  Neurological:     Mental Status: She is alert.      No results found for any visits on 12/08/22.    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  Patient was seen by her neurologist yesterday and urgent referral to vein vascular and neurology was placed.  Patient is still having pain but the hydrocodone does help she use the medication sparingly will give her a short course to get her through until she sees the specialist.  Sedation precautions and constipation precautions reviewed in regards to medication use      Relevant Medications   HYDROcodone-acetaminophen (NORCO/VICODIN) 5-325 MG tablet     Other   Hospital discharge follow-up    Patient was seen in the emergency department on 12/03/2022.  Did review emergency department note along with labs and imaging       Return in about 6 months (around 06/09/2023) for CPE and Labs.    Audria Nine, NP

## 2022-12-17 ENCOUNTER — Other Ambulatory Visit: Payer: Self-pay | Admitting: Nurse Practitioner

## 2022-12-17 MED ORDER — ONDANSETRON 4 MG PO TBDP: 4.0000 mg | ORAL_TABLET | Freq: Three times a day (TID) | ORAL | 0 refills | Status: DC | PRN

## 2022-12-17 MED ORDER — HYDROCODONE-ACETAMINOPHEN 5-325 MG PO TABS: 1.0000 | ORAL_TABLET | Freq: Three times a day (TID) | ORAL | 0 refills | Status: AC | PRN

## 2022-12-20 NOTE — Telephone Encounter (Signed)
Please advise sent message to patient to let know out of the office.

## 2022-12-24 ENCOUNTER — Other Ambulatory Visit: Payer: Self-pay | Admitting: Nurse Practitioner

## 2022-12-24 ENCOUNTER — Telehealth: Payer: Self-pay | Admitting: Nurse Practitioner

## 2022-12-24 NOTE — Telephone Encounter (Signed)
Please see note from Urology:  Per Dr.Sinisky. PT should be referred to vascular surgery here at [facility], they are seeing that & she was seen by vascular at [facility], can see urology at [facility] if prefers.   Per Dr.Sinisky. PT should be referred to vascular surgery here at [facility], they are seeing that & she was seen by vascular at [facility], can see urology at [facility] if prefers.

## 2022-12-24 NOTE — Addendum Note (Signed)
Addended by: Eden Emms on: 12/24/2022 08:07 AM   Modules accepted: Orders

## 2022-12-25 ENCOUNTER — Other Ambulatory Visit (HOSPITAL_COMMUNITY): Payer: Self-pay

## 2022-12-25 ENCOUNTER — Telehealth: Payer: Self-pay | Admitting: Pharmacy Technician

## 2022-12-25 NOTE — Telephone Encounter (Signed)
Pharmacy Patient Advocate Encounter  Received notification from [facility] that Prior Authorization for HYDROcodone-Acetaminophen 5-325MG  tablets has been APPROVED from 12/25/2022 to 06/23/2023.Marland Kitchen  PA #/Case ID/Reference #: [id number]

## 2022-12-27 NOTE — Telephone Encounter (Signed)
Can we relay the message to the patient. Either she can be seen by vascular through [facility] or urology through [facility].

## 2022-12-27 NOTE — Telephone Encounter (Signed)
Pt requested Gabapentin 300mg  renewal. Do not see prescription start date or quantity.       LAST APPOINTMENT DATE: 12/08/22   NEXT APPOINTMENT DATE: 12/30/2022   LAST REFILL: Ordering date 11/02/22

## 2022-12-28 NOTE — Telephone Encounter (Signed)
Called patient has consult with [facility] oncology tomorrow. She had scans reviewed with vascular and that was what they recommended. She would like to hold off with urology for now. Will follow up at next visit with our office.

## 2022-12-28 NOTE — Telephone Encounter (Signed)
I did not prescribe it. Can see get it from the prescriber

## 2022-12-30 ENCOUNTER — Telehealth: Payer: Self-pay | Admitting: Nurse Practitioner

## 2022-12-30 ENCOUNTER — Ambulatory Visit (INDEPENDENT_AMBULATORY_CARE_PROVIDER_SITE_OTHER): Payer: Commercial Managed Care - PPO | Admitting: Nurse Practitioner

## 2022-12-30 VITALS — BP 110/78 | HR 93 | Temp 97.9°F | Ht 63.0 in | Wt 150.0 lb

## 2022-12-30 DIAGNOSIS — R0602 Shortness of breath: Secondary | ICD-10-CM | POA: Diagnosis not present

## 2022-12-30 DIAGNOSIS — R21 Rash and other nonspecific skin eruption: Secondary | ICD-10-CM

## 2022-12-30 DIAGNOSIS — R0789 Other chest pain: Secondary | ICD-10-CM

## 2022-12-30 DIAGNOSIS — R300 Dysuria: Secondary | ICD-10-CM

## 2022-12-30 DIAGNOSIS — R1032 Left lower quadrant pain: Secondary | ICD-10-CM | POA: Insufficient documentation

## 2022-12-30 HISTORY — DX: Other chest pain: R07.89

## 2022-12-30 LAB — POCT URINALYSIS DIPSTICK
Bilirubin, UA: NEGATIVE
Blood, UA: NEGATIVE
Glucose, UA: NEGATIVE
Ketones, UA: POSITIVE
Leukocytes, UA: NEGATIVE
Nitrite, UA: NEGATIVE
Protein, UA: NEGATIVE
Spec Grav, UA: 1.01 (ref 1.010–1.025)
Urobilinogen, UA: 0.2 E.U./dL
pH, UA: 6 (ref 5.0–8.0)

## 2022-12-30 MED ORDER — FLUCONAZOLE 150 MG PO TABS: 150.0000 mg | ORAL_TABLET | Freq: Once | ORAL | 0 refills | Status: AC

## 2022-12-30 NOTE — Assessment & Plan Note (Signed)
Recent seen in the emergency department states she was given a one-time liquid antibiotic but threw it up.  Did write a prescription for Keflex but patient has not got it filled yet we will rerun urinalysis along with culture here if symptoms get worse she can start taking the antibiotic over the weekend

## 2022-12-30 NOTE — Progress Notes (Addendum)
Established Patient Office Visit  Subjective   Patient ID: Stefanie Parks, female    DOB: 1990/10/28  Age: 32 y.o. MRN: [id number]  Chief Complaint  Patient presents with   FMLA    Pt requests full leave FMLA. Condition is getting worse. Pt is in a level 10 pain. Excessive sweating.     Chest Pain    Chest pain feels electric and heavy.     Respiratory Distress    Trouble breathing.   Dysuria    Pt states it is getting harder to urinate. Discomfort on left lower back. Has to strain and push her stomach in to get rest of urine out.       Abdominal pain: Patient has been evaluated numerous times in the emergency department.  Patient is also been evaluated me, GI, vascular.  Patient is currently undergoing a workup for nutcracker syndrome.  Patient was seen by vascular and recommended to go to urology.  She reached out to me referral was placed recommended following up with [facility] urology.  Patient reach back out and stated he does not need that she is coming being seen by [facility] oncology center. Was seen by [facility] cancer center yesterday 12/29/2022. She has follow ups with them on 07/22 and 01/07/2023  She has been seen by GI and they referred her for allergy testing. She was allergy tested and states no food allergy.  Patient does have a remote history of tick bite has not been tested for alpha gal.   Chest pain/SHOB: states that she is having trouble with breathing with rest an exertion. States that when she is getting up she is ok but after she is getting tired fatigued, weak, and sweaty. States that her oral intake is decreased inclusive of fluid. She has also lost weigtt and is continueing to do so.  Patient is on OCP and has had made several trips to [place] back and [place] and back.  No history of DVT or PE.  FMLA: states that she is requesting continuous FMLA. She had her first procedure on 12/22/2022 9tnh was the last day at work and went back to [place] and then brought up the doudeum and  went to see surgical oncollgy . States that she called the company on 12/26/2022 for leave of absence.   Rash: At the end of the visit patient did mention having a rash to her bilateral inguinal area but spreading superior inferiorly.  It does itch.  Stated started after she had the vascular study.  Review of Systems  Constitutional:  Negative for chills and fever.  Respiratory:  Positive for shortness of breath.   Cardiovascular:  Positive for chest pain.  Genitourinary:  Positive for dysuria.  Neurological:  Positive for dizziness.      Objective:     BP 110/78   Pulse 93   Temp 97.9 F (36.6 C) (Temporal)   Ht 5\' 3"  (1.6 m)   Wt 150 lb (68 kg)   LMP 11/13/2022 (Approximate)   SpO2 98%   BMI 26.57 kg/m  BP Readings from Last 3 Encounters:  12/30/22 110/78  12/08/22 118/64  11/02/22 105/70   Wt Readings from Last 3 Encounters:  12/30/22 150 lb (68 kg)  12/08/22 156 lb (70.8 kg)  11/02/22 159 lb 12.8 oz (72.5 kg)      Physical Exam Vitals and nursing note reviewed.  Constitutional:      General: She is not in acute distress.    Appearance: Normal appearance. She  is ill-appearing.  Cardiovascular:     Rate and Rhythm: Normal rate and regular rhythm.     Heart sounds: Normal heart sounds.  Pulmonary:     Effort: Pulmonary effort is normal.     Breath sounds: Normal breath sounds.  Abdominal:     General: Bowel sounds are normal.  Neurological:     Mental Status: She is alert.      Results for orders placed or performed in visit on 12/30/22  POCT urinalysis dipstick  Result Value Ref Range   Color, UA light yellow    Clarity, UA clear    Glucose, UA Negative Negative   Bilirubin, UA Negative    Ketones, UA Positive    Spec Grav, UA 1.010 1.010 - 1.025   Blood, UA Negative    pH, UA 6.0 5.0 - 8.0   Protein, UA Negative Negative   Urobilinogen, UA 0.2 0.2 or 1.0 E.U./dL   Nitrite, UA Negative    Leukocytes, UA Negative Negative   Appearance      Odor        The ASCVD Risk score (Arnett DK, et al., 2019) failed to calculate for the following reasons:   The 2019 ASCVD risk score is only valid for ages 52 to 41    Assessment & Plan:   Problem List Items Addressed This Visit       Other   Shortness of breath - Primary    Ambiguous in nature.  Will check D-dimer.  EKG within normal limits.      Relevant Orders   EKG 12-Lead (Completed)   D-dimer, quantitative   Atypical chest pain    Ambiguous in nature.  EKG within normal limits.  Check D-dimer to rule out possible PE      Relevant Orders   EKG 12-Lead (Completed)   D-dimer, quantitative   Left lower quadrant abdominal pain    Patient sitting with the left-sided abdominal pain.  Does qualify for nutcracker syndrome do not think is causing her problem is being worked up for [employer].      Relevant Orders   Alpha-Gal Panel   Dysuria    Recent seen in the emergency department states she was given a one-time liquid antibiotic but threw it up.  Did write a prescription for Keflex but patient has not got it filled yet we will rerun urinalysis along with culture here if symptoms get worse she can start taking the antibiotic over the weekend      Relevant Orders   POCT urinalysis dipstick (Completed)   Urine Culture    Return if symptoms worsen or fail to improve.    Audria Nine, NP

## 2022-12-30 NOTE — Assessment & Plan Note (Signed)
Ambiguous in nature.  EKG within normal limits.  Check D-dimer to rule out possible PE

## 2022-12-30 NOTE — Assessment & Plan Note (Signed)
Ambiguous in nature.  Will check D-dimer.  EKG within normal limits.

## 2022-12-30 NOTE — Patient Instructions (Signed)
Nice to see you today Take over the counter analgesics as needed EKG looked ok I will be in touch with the labs and let you know when I have completed the [facility] paperwork

## 2022-12-30 NOTE — Assessment & Plan Note (Signed)
Patient sitting with the left-sided abdominal pain.  Does qualify for nutcracker syndrome do not think is causing her problem is being worked up for [employer].

## 2022-12-30 NOTE — Addendum Note (Signed)
Addended by: Eden Emms on: 12/30/2022 04:23 PM   Modules accepted: Orders

## 2022-12-30 NOTE — Telephone Encounter (Signed)
Completed patients FMLA paperwork and have placed it in the MA outbox. Please fill in the office information and look over the form for completion

## 2022-12-31 ENCOUNTER — Telehealth: Payer: Self-pay

## 2022-12-31 ENCOUNTER — Encounter: Payer: Self-pay | Admitting: Nurse Practitioner

## 2022-12-31 LAB — ALPHA-GAL PANEL
Allergen, Mutton, f88: <0.1 kU/L
Beef: <0.1 kU/L
CLASS: 0
Class: 0

## 2022-12-31 LAB — URINE CULTURE: SPECIMEN QUALITY:: ADEQUATE

## 2022-12-31 LAB — INTERPRETATION:

## 2022-12-31 NOTE — Telephone Encounter (Signed)
Called patient to inform her that the FMLA PPW was faxed and confirmed patient would like a copy mailed to her address.  Pt. Has no other concerns.  Melina Copa, CMA

## 2022-12-31 NOTE — Telephone Encounter (Signed)
Opened in error.   "Completed patients FMLA paperwork and have placed it in the MA outbox. Please fill in the office information and look over the form for completion"

## 2023-01-03 ENCOUNTER — Encounter: Payer: Self-pay | Admitting: Nurse Practitioner

## 2023-01-03 DIAGNOSIS — R748 Abnormal levels of other serum enzymes: Secondary | ICD-10-CM

## 2023-01-05 LAB — ALPHA-GAL PANEL
Allergen, Pork, f26: <0.1 kU/L
CLASS: 0
GALACTOSE-ALPHA-1,3-GALACTOSE IGE*: <0.1 kU/L (ref <0.10)

## 2023-01-05 LAB — URINE CULTURE
MICRO NUMBER:: 15217648
Result:: NO GROWTH

## 2023-01-05 LAB — D-DIMER, QUANTITATIVE: D-Dimer, Quant: 0.29 mcg/mL FEU (ref <0.50)

## 2023-01-11 ENCOUNTER — Ambulatory Visit: Payer: Self-pay | Admitting: Urology

## 2023-01-12 ENCOUNTER — Telehealth: Payer: Self-pay | Admitting: Nurse Practitioner

## 2023-01-12 NOTE — Telephone Encounter (Signed)
Pt states she is not working at all. 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 States that work conditions are terrible and not suitable for employees.  Pt states she stays in the bed most of the time.

## 2023-01-12 NOTE — Telephone Encounter (Signed)
Can we reach out to the patient and see if she is working at all. Also if she is is she doing light duty. Please get a list of her job duties/responsibility. I got a form from [employer] for additional information

## 2023-01-17 ENCOUNTER — Encounter: Payer: Self-pay | Admitting: Nurse Practitioner

## 2023-01-17 NOTE — Telephone Encounter (Signed)
Form has been completed I did write her out until 02/02/2023. I see she had her procedure. Does she have a follow up with the surgeon?  Paperwork is in outgoing CMA box. Please retain a copy for the office

## 2023-01-17 NOTE — Telephone Encounter (Signed)
Called patient states she does not feel like se will be ready to return to work on 8/21. Advised that any further time would need to be addressed with specialist. I have faxed form to number provided on cover sheet for [facility]

## 2023-01-20 ENCOUNTER — Other Ambulatory Visit: Payer: Self-pay | Admitting: Nurse Practitioner

## 2023-01-21 MED ORDER — DICYCLOMINE HCL 20 MG PO TABS: 20.0000 mg | ORAL_TABLET | Freq: Four times a day (QID) | ORAL | 0 refills | Status: DC

## 2023-01-21 MED ORDER — ONDANSETRON 4 MG PO TBDP: 4.0000 mg | ORAL_TABLET | Freq: Three times a day (TID) | ORAL | 0 refills | Status: DC | PRN

## 2023-01-25 ENCOUNTER — Ambulatory Visit (INDEPENDENT_AMBULATORY_CARE_PROVIDER_SITE_OTHER): Payer: Commercial Managed Care - PPO | Admitting: Obstetrics and Gynecology

## 2023-01-25 ENCOUNTER — Other Ambulatory Visit (HOSPITAL_COMMUNITY)
Admission: RE | Admit: 2023-01-25 | Discharge: 2023-01-25 | Disposition: A | Discharge status: Home / Self Care | Payer: Commercial Managed Care - PPO | Source: Ambulatory Visit | Attending: Obstetrics and Gynecology | Admitting: Obstetrics and Gynecology

## 2023-01-25 ENCOUNTER — Encounter: Payer: Self-pay | Admitting: Obstetrics and Gynecology

## 2023-01-25 VITALS — BP 129/81 | HR 83 | Wt 149.4 lb

## 2023-01-25 DIAGNOSIS — R1084 Generalized abdominal pain: Secondary | ICD-10-CM

## 2023-01-25 DIAGNOSIS — Z124 Encounter for screening for malignant neoplasm of cervix: Secondary | ICD-10-CM | POA: Diagnosis present

## 2023-01-25 DIAGNOSIS — D259 Leiomyoma of uterus, unspecified: Secondary | ICD-10-CM

## 2023-01-25 DIAGNOSIS — K589 Irritable bowel syndrome without diarrhea: Secondary | ICD-10-CM | POA: Insufficient documentation

## 2023-01-25 NOTE — Progress Notes (Signed)
GYNECOLOGY OFFICE VISIT NOTE  History:   Stefanie Parks is a 32 y.o. G0 here today for multiple concerns that seem to be related. Ultimately she would like to have endometriosis ruled out.   She has seen GI, general surgery, orthopedic surgery and her PCP. She primarily has left upper quadrant pain but also pain diffusely in her abdomen. She has urinary issues of retention. The pain is like a spasm pain for at least a year. She tried to work through it but recently went on FMLA due to the severity of the pain.   Sexual activity - she is sexually active with a female partner for the last 4 years. She isn't able to be sexually active due to pain. Cycles are regular.   She did not know she had a fibroid. I reviewed her CT reports from 11/2022 and 06/2022. I reviewed the GI endoscopy note. I reviewed her last note with her general surgeon, PCP and GI doctor.    Past Medical History:  Diagnosis Date   Concussion    x2   Migraines     Past Surgical History:  Procedure Laterality Date   ANTERIOR CRUCIATE LIGAMENT REPAIR     right    The following portions of the patient's history were reviewed and updated as appropriate: allergies, current medications, past family history, past medical history, past social history, past surgical history and problem list.   Health Maintenance:   No recent pap on file.    Review of Systems:  Pertinent items noted in HPI and remainder of comprehensive ROS otherwise negative.  Physical Exam:  BP 129/81   Pulse 83   Wt 149 lb 6.4 oz (67.8 kg)   LMP 11/13/2022 (Approximate)   BMI 26.47 kg/m  CONSTITUTIONAL: Well-developed, well-nourished female in no acute distress.  HEENT:  Normocephalic, atraumatic. External right and left ear normal. No scleral icterus.  NECK: Normal range of motion, supple, no masses noted on observation SKIN: No rash noted. Not diaphoretic. No erythema. No pallor. MUSCULOSKELETAL: Normal range of motion. No edema  noted. NEUROLOGIC: Alert and oriented to person, place, and time. Normal muscle tone coordination. No cranial nerve deficit noted. PSYCHIATRIC: Normal mood and affect. Normal behavior. Normal judgment and thought content.  PELVIC: Normal appearing external genitalia; normal urethral meatus; normal appearing vaginal mucosa and cervix. Bilateral levator and obturator pain and spasms. Some vaginismus.  No abnormal discharge noted.  Normal uterine size, no other palpable masses, no uterine or adnexal tenderness. Performed in the presence of a chaperone  Labs and Imaging No results found for this or any previous visit (from the past 168 hour(s)). No results found.  Assessment and Plan:   1. Cervical cancer screening Pap and cultures done today since no recent testign.  -     Cytology - PAP  2. Uterine leiomyoma, unspecified location Check pelvic US to better characterize fibroid. No CT has characterized it.  -     US PELVIC COMPLETE WITH TRANSVAGINAL; Future  3. Generalized abdominal pain Discussed possible endometriosis. Only way to know definitively is to do diagnostic laparoscopy. Discussed surgery and what it entails. She would like to proceed with this just to be able to rule it in/out. We discussed probable eventual role of PFPT for pelvic floor dysfunction which may or may not be contributing. Reviewed I would do a thing called a TAP block at the time of her surgery to see if this provides some relief as well. Pt agrees with plan. Message  sent to schedule surgery   No orders of the defined types were placed in this encounter.    Routine preventative health maintenance measures emphasized. Please refer to After Visit Summary for other counseling recommendations.   Return for pelvic ultrasound.  Milas Hock, MD, FACOG Obstetrician & Gynecologist, [facility] for [facility], [facility]

## 2023-01-25 NOTE — Progress Notes (Signed)
CC: Wanting to Rule out endometriosis  Having trouble with urination, and bowel movement  Having spasms in abdomen and left flank  X 1 year   Straining to get out urine    Has seen surg/onc and had endoscopy done recently   Out of work on [employer] due to this issue    Also noticing some weight loss- roughly lost about 10 lbs or so   Sees PCP regularly   UTI- has been on abx several times now

## 2023-01-27 ENCOUNTER — Telehealth: Payer: Self-pay

## 2023-01-27 ENCOUNTER — Ambulatory Visit: Payer: Commercial Managed Care - PPO | Attending: Obstetrics and Gynecology

## 2023-01-27 NOTE — Transitions of Care (Post Inpatient/ED Visit) (Signed)
   01/27/2023  Name: Stefanie Parks MRN: [id number] DOB: 1991-02-28  Today's TOC FU Call Status: Today's TOC FU Call Status:: Unsuccessful Call (1st Attempt) Unsuccessful Call (1st Attempt) Date: 01/27/23  Attempted to reach the patient regarding the most recent Inpatient/ED visit.  Follow Up Plan: Additional outreach attempts will be made to reach the patient to complete the Transitions of Care (Post Inpatient/ED visit) call.   Signature   Woodfin Ganja LPN [facility] Nurse Health Advisor Direct Dial [phone]

## 2023-01-29 ENCOUNTER — Encounter: Payer: Self-pay | Admitting: Obstetrics and Gynecology

## 2023-01-31 NOTE — Transitions of Care (Post Inpatient/ED Visit) (Signed)
I spoke with pt; pt has lt flank pain, nausea& cosntipation. pt has irregular Bm for "awhile" pt had loose stool earlier this morning. pt still has lower abd pain with pain level of 6; no fever. pt scheduling MRI and then will schedule surgery for SMA syndrome pt also scheduling appt with GYN also.pt is eating regular diet but eating smaller portions. Pt taking OTC med for pain.Pt will call PCP if appt needed. Sending note to Mordecai Maes NP.      01/31/2023  Name: Stefanie Parks MRN: [id number] DOB: 14-Sep-1990  Today's TOC FU Call Status: Today's TOC FU Call Status:: Successful TOC FU Call Completed Unsuccessful Call (1st Attempt) Date: 01/27/23 [facility] FU Call Complete Date: 01/31/23  Transition Care Management Follow-up Telephone Call Date of Discharge: 01/26/23 Discharge Facility: Other ([facility]) Name of Other ([facility]) [facility] Facility: [facility] ED Type of Discharge: Emergency Department Reason for ED Visit: Other: (lt flank pain, nausea& cosntipation. pt has irregular Bm for "awhile" pt had loose stool earlier this morning. pt still has lower abd pain with pain level of 6; no fever. pt scheduling MRI and then will schedule surgery for SMA syndrome.) How have you been since you were released from the hospital?: Better (eating makes pain worse.) Any questions or concerns?: No  Items Reviewed: Did you receive and understand the discharge instructions provided?: Yes Medications obtained,verified, and reconciled?: Yes (Medications Reviewed) Any new allergies since your discharge?: No Dietary orders reviewed?: NA (pt sais was not advised about changing diet; pt is eating smaller portions of regualr diet.) Do you have support at home?: Yes People in Home: significant other Name of Support/Comfort Primary Source: Stefanie Parks  Medications Reviewed Today: Medications Reviewed Today   Medications were not reviewed in this encounter     Home Care and Equipment/Supplies: Were  Home Health Services Ordered?: NA Any new equipment or medical supplies ordered?: NA  Functional Questionnaire: Do you need assistance with bathing/showering or dressing?: No Do you need assistance with meal preparation?: No Do you need assistance with eating?: No Do you have difficulty maintaining continence: No Do you need assistance with getting out of bed/getting out of a chair/moving?: No Do you have difficulty managing or taking your medications?: No  Follow up appointments reviewed: PCP Follow-up appointment confirmed?: NA (pt scheduling FU with surgeon and GYN and pt will call pcp for appt if needed.) Date of PCP follow-up appointment?: 01/28/23 Follow-up Provider: [facility] surgical dept. Specialist Hospital Follow-up appointment confirmed?: Yes Date of Specialist follow-up appointment?:  (pt is waitng on appt for MRI and then will schedule surgery for SMA syndrome. pt is also going to schefule appt with GYN.) Follow-Up Specialty Provider:: surgery and GYN dept. Do you need transportation to your follow-up appointment?: No Do you understand care options if your condition(s) worsen?: Yes-patient verbalized understanding    SIGNATURE Lewanda Rife, LPN

## 2023-01-31 NOTE — Telephone Encounter (Signed)
noted 

## 2023-02-11 ENCOUNTER — Ambulatory Visit: Payer: Commercial Managed Care - PPO | Admitting: Nurse Practitioner

## 2023-02-11 ENCOUNTER — Encounter: Payer: Self-pay | Admitting: Nurse Practitioner

## 2023-02-11 VITALS — BP 100/64 | HR 88 | Temp 99.5°F | Ht 63.0 in | Wt 146.4 lb

## 2023-02-11 DIAGNOSIS — F5104 Psychophysiologic insomnia: Secondary | ICD-10-CM

## 2023-02-11 MED ORDER — ONDANSETRON 4 MG PO TBDP: 4.0000 mg | ORAL_TABLET | Freq: Three times a day (TID) | ORAL | 0 refills | Status: AC | PRN

## 2023-02-11 MED ORDER — HYDROXYZINE PAMOATE 25 MG PO CAPS: 25.0000 mg | ORAL_CAPSULE | Freq: Every evening | ORAL | 0 refills | Status: DC | PRN

## 2023-02-11 NOTE — Assessment & Plan Note (Signed)
Multifactorial.  Patient is not using as much energy as of late as she has been out of work.  Does have abdominal pain that is been currently worked up with [facility] surgical oncology team.  Patient already on sertraline 100 mg.  Did discuss putting patient on duloxetine but would have to titrate off of sertraline to start duloxetine.  Patient does not want to do that.  Will try hydroxyzine 25 mg nightly as needed.

## 2023-02-11 NOTE — Patient Instructions (Signed)
Nice to see you today Take the hydroxyzine 30-45 mins prior to bedtime.  Follow up if it is not helping

## 2023-02-11 NOTE — Progress Notes (Signed)
Acute Office Visit  Subjective:     Patient ID: Stefanie Parks, female    DOB: 04-Nov-1990, 32 y.o.   MRN: [id number]  Chief Complaint  Patient presents with   Insomnia    Pt complains of the need for medication to help sleep. Pt states that her doctor will not prescribe her any sleep medication unless being treated. Pt complain of getting 4-5 hours of sleep. Hard to fall asleep.      Patient is in today for insomnia  Patient has been seen multiple times in regards to abdominal pain.  Patient is currently followed by surgical oncology through [facility].  She is underwent MRI, upper GI series and is scheduled for a diagnostic laparoscopic procedure.  Patient is having difficulty with pain management and being able to sleep due to the discomfort and is here for follow-up. Patient was last seen by Dr. Alton Revere on 01/28/2023 where they scheduled the laparoscopic diagnostic procedure.  States that she has asked for help with sleep and pain.States that Dr Izola Price states that he would not per the patient report   States that she is tradionally 1030 and get up ate 4. State sthat hse would get up to work out and go to work. States that she is now not working and will wake up at 4  Review of Systems  Constitutional:  Negative for chills and fever.  Respiratory:  Negative for shortness of breath.   Cardiovascular:  Negative for chest pain.  Gastrointestinal:  Positive for abdominal pain and constipation. Negative for diarrhea, nausea and vomiting.  Neurological:  Negative for headaches.  Psychiatric/Behavioral:  The patient has insomnia.         Objective:    BP 100/64   Pulse 88   Temp 99.5 F (37.5 C) (Temporal)   Ht 5\' 3"  (1.6 m)   Wt 146 lb 6.4 oz (66.4 kg)   LMP  (LMP Unknown)   SpO2 96%   BMI 25.93 kg/m    Physical Exam Vitals and nursing note reviewed.  Constitutional:      Appearance: Normal appearance.  Cardiovascular:     Rate and Rhythm: Normal rate and regular  rhythm.     Heart sounds: Normal heart sounds.  Pulmonary:     Effort: Pulmonary effort is normal.     Breath sounds: Normal breath sounds.  Neurological:     Mental Status: She is alert.     No results found for any visits on 02/11/23.      Assessment & Plan:   Problem List Items Addressed This Visit       Other   Psychophysiological insomnia - Primary    Multifactorial.  Patient is not using as much energy as of late as she has been out of work.  Does have abdominal pain that is been currently worked up with [facility] surgical oncology team.  Patient already on sertraline 100 mg.  Did discuss putting patient on duloxetine but would have to titrate off of sertraline to start duloxetine.  Patient does not want to do that.  Will try hydroxyzine 25 mg nightly as needed.      Relevant Medications   hydrOXYzine (VISTARIL) 25 MG capsule    Meds ordered this encounter  Medications   hydrOXYzine (VISTARIL) 25 MG capsule    Sig: Take 1 capsule (25 mg total) by mouth at bedtime as needed.    Dispense:  30 capsule    Refill:  0    Order Specific  Question:   Supervising Provider    Answer:   Roxy Manns A [[id number]]   ondansetron (ZOFRAN-ODT) 4 MG disintegrating tablet    Sig: Take 1 tablet (4 mg total) by mouth every 8 (eight) hours as needed for nausea or vomiting.    Dispense:  20 tablet    Refill:  0    Order Specific Question:   Supervising Provider    Answer:   TOWER, MARNE A [[id number]]    Return if symptoms worsen or fail to improve.  Audria Nine, NP

## 2023-02-14 ENCOUNTER — Encounter: Payer: Self-pay | Admitting: Nurse Practitioner

## 2023-02-14 DIAGNOSIS — G479 Sleep disorder, unspecified: Secondary | ICD-10-CM

## 2023-02-21 MED ORDER — TRAZODONE HCL 50 MG PO TABS: 50.0000 mg | ORAL_TABLET | Freq: Every day | ORAL | 0 refills | Status: DC

## 2023-03-10 ENCOUNTER — Encounter: Payer: Self-pay | Admitting: Nurse Practitioner

## 2023-03-11 ENCOUNTER — Ambulatory Visit (INDEPENDENT_AMBULATORY_CARE_PROVIDER_SITE_OTHER): Payer: Commercial Managed Care - PPO | Admitting: Primary Care

## 2023-03-11 ENCOUNTER — Encounter: Payer: Self-pay | Admitting: Primary Care

## 2023-03-11 ENCOUNTER — Ambulatory Visit (INDEPENDENT_AMBULATORY_CARE_PROVIDER_SITE_OTHER)
Admission: RE | Admit: 2023-03-11 | Discharge: 2023-03-11 | Disposition: A | Discharge status: Home / Self Care | Payer: Commercial Managed Care - PPO | Source: Ambulatory Visit | Attending: Primary Care | Admitting: Primary Care

## 2023-03-11 VITALS — BP 116/68 | HR 88 | Temp 97.5°F | Ht 63.0 in | Wt 146.0 lb

## 2023-03-11 DIAGNOSIS — G8929 Other chronic pain: Secondary | ICD-10-CM

## 2023-03-11 DIAGNOSIS — M79601 Pain in right arm: Secondary | ICD-10-CM

## 2023-03-11 NOTE — Assessment & Plan Note (Addendum)
It seems that her pain originated from a tendinitis of the right shoulder quite some time ago which has since become problematic to the right elbow causing pain to the right upper extremity.  Consider Raynaud's phenomenon, especially given color changes.   She certainly has nerve involvement given her symptoms. Her veins do not appear engorged on today's exam.  No evidence of compartment syndrome, cellulitis.  She has great pulses.  Reassurance provided.    Agreed to proceed with x-rays per her request. X-ray of the hand and wrist to right side ordered and pending.  Continue gabapentin 300 mg twice daily.  Recommended that she get with PCP postoperatively and after recovery to discuss orthopedic referral for steroid injections of the right shoulder and potentially right elbow.

## 2023-03-11 NOTE — Progress Notes (Signed)
Subjective:    Patient ID: Stefanie Parks, female    DOB: 1990-07-23, 32 y.o.   MRN: [id number]  Arm Pain  Associated symptoms include numbness.    Tayanna Talford is a very pleasant 32 y.o. female patient of Matt, NP with a history of nutcracker phenomenon of renal vein, IBS, anxiety, paresthesias, migraines who presents today to discuss upper extremity pain.  Chronic history of right upper extremity pain which originates at the right shoulder and right elbow with radiation of pain down to right finger tips.  Also with intermittent changes in color to her right upper extremity compared to her left.  Colors include bright red and purple.  History of dog bite injury to the right anterior arm years ago. Evaluated by [facility] previously for EMG testing, results were negative. She was then referred to [facility] for a second opinion and was told that the pain to her right forearm was the brain remembering the prior trauma.  Diagnosed with tendinitis to her right shoulder years ago. 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  Historically has done a lot of repetitive heavy lifting and carrying.  In her current role she spends time working at a desk typing and helping employees on the floor with heavy objects. She has been on medical leave since July this year.  More recently she's noticed increased pain to the right elbow with radiation down to her finger tips.  Her pain is worse with flexion of her upper extremity at the elbow, for example when talking on the phone. She has also noticed the veins on her right upper extremity to be more engorged than her left. Her partner touched the vein a few nights ago and she noticed a moderate burning pain with tenderness. She continues to notice pressure, burning, and swelling to her right forearm and hand.   She has been playing nintendo switch recently which requires fine motor movement of her  hands.  She is active at home with house hold chores.   Years ago as a child she broke her right arm.  Her mother was told that the break was just above the growth plate.  She is concerned that there may be some damage causing her current symptoms.  She would like x-rays.  Following with oncology for superior mesenteric artery syndrome and is pending diagnostic laparoscopic, laparoscopic versus open bowel resection versus bowel bypass, and possible cholecystectomy for 03/22/2023.  She wants to make sure that she can still proceed with surgery given her symptoms.  She is managed on gabapentin 300 mg 2 times daily which helps some.  She has not seen orthopedics for reevaluation of the tendinitis that was diagnosed to the right shoulder years ago.  Review of Systems  Musculoskeletal:  Positive for arthralgias and myalgias.  Skin:  Positive for color change.  Neurological:  Positive for numbness.         Past Medical History:  Diagnosis Date   Concussion    x2   Migraines     Social History   Socioeconomic History   Marital status: Significant Other    Spouse name: Not on file   Number of children: 0   Years of education: Not on file   Highest education level: 12th grade  Occupational History   Not on file  Tobacco Use   Smoking status: Former    Types: Cigarettes   Smokeless tobacco: Never  Vaping Use   Vaping status: Every Day   Substances: Nicotine, Flavoring  Substance and Sexual Activity   Alcohol use: Not Currently   Drug use: No    Comment: delta 8   Sexual activity: Yes    Birth control/protection: None  Other Topics Concern   Not on file  Social History Narrative   Fulltime: [employer]      Hobbies:exercise, bike riding, active Therapist, music   Social Determinants of Health   Financial Resource Strain: High Risk (12/30/2022)   Overall Financial Resource Strain (CARDIA)    Difficulty of Paying Living Expenses: Hard  Food Insecurity: No Food Insecurity (12/30/2022)    Hunger Vital Sign    Worried About Running Out of Food in the Last Year: Never true    Ran Out of Food in the Last Year: Never true  Transportation Needs: Unmet Transportation Needs (12/30/2022)   PRAPARE - Transportation    Lack of Transportation (Medical): Yes    Lack of Transportation (Non-Medical): Yes  Physical Activity: Unknown (12/30/2022)   Exercise Vital Sign    Days of Exercise per Week: 0 days    Minutes of Exercise per Session: Not on file  Stress: Stress Concern Present (12/30/2022)   [employer] of Occupational Health - Occupational Stress Questionnaire    Feeling of Stress : Very much  Social Connections: Socially Isolated (12/30/2022)   Social Connection and Isolation Panel [NHANES]    Frequency of Communication with Friends and Family: Never    Frequency of Social Gatherings with Friends and Family: Once a week    Attends Religious Services: Never    Database administrator or Organizations: No    Attends Engineer, structural: Not on file    Marital Status: Living with partner  Intimate Partner Violence: Not on file    Past Surgical History:  Procedure Laterality Date   ANTERIOR CRUCIATE LIGAMENT REPAIR     right    Family History  Adopted: Yes    Allergies  Allergen Reactions   Nickel    Wound Dressings Rash    Current Outpatient Medications on File Prior to Visit  Medication Sig Dispense Refill   butalbital-acetaminophen-caffeine (FIORICET) 50-325-40 MG tablet Take 1 tablet by mouth every 4 (four) hours as needed.     dicyclomine (BENTYL) 20 MG tablet Take 1 tablet (20 mg total) by mouth every 6 (six) hours. 120 tablet 0   fluticasone (FLONASE) 50 MCG/ACT nasal spray Place 2 sprays into both nostrils daily.     gabapentin (NEURONTIN) 300 MG capsule Take 300 mg by mouth 2 (two) times daily.     ibuprofen (ADVIL) 600 MG tablet Take 1 tablet (600 mg total) by mouth every 6 (six) hours as needed. 30 tablet 0   levonorgestrel-ethinyl estradiol  (ALTAVERA) 0.15-30 MG-MCG tablet Take 1 tablet by mouth daily.     meclizine (ANTIVERT) 25 MG tablet Take 25 mg by mouth 3 (three) times daily as needed for dizziness.     ondansetron (ZOFRAN-ODT) 4 MG disintegrating tablet Take 1 tablet (4 mg total) by mouth every 8 (eight) hours as needed for nausea or vomiting. 20 tablet 0   sertraline (ZOLOFT) 100 MG tablet Take 2 tablets (200 mg total) by mouth daily. 60 tablet 0   traZODone (DESYREL) 50 MG tablet Take 1 tablet (50 mg total) by mouth at bedtime. 90 tablet 0   fluconazole (DIFLUCAN) 150 MG tablet Take 150 mg by mouth once. (Patient not taking: Reported on 03/11/2023)  No current facility-administered medications on file prior to visit.    BP 116/68   Pulse 88   Temp (!) 97.5 F (36.4 C) (Temporal)   Ht 5\' 3"  (1.6 m)   Wt 146 lb (66.2 kg)   LMP  (LMP Unknown)   SpO2 98%   BMI 25.86 kg/m  Objective:   Physical Exam Constitutional:      General: She is not in acute distress. Cardiovascular:     Rate and Rhythm: Normal rate and regular rhythm.     Pulses:          Radial pulses are 2+ on the right side and 2+ on the left side.     Comments: On today's exam, veins to the hands and forearms appear similar to those of the left upper extremity.  She does have a tattoo sleeve in place in the left upper extremity which does complicate exam some. Pulmonary:     Effort: Pulmonary effort is normal.     Breath sounds: Normal breath sounds.  Musculoskeletal:     Right upper arm: Tenderness present. No swelling or deformity.     Left upper arm: No swelling or deformity.     Right elbow: No swelling. Normal range of motion.     Right forearm: Tenderness present. No swelling.     Right wrist: Tenderness present. No swelling. Normal range of motion.     Right hand: Tenderness present. No swelling. Normal range of motion.  Skin:    General: Skin is warm and dry.     Coloration: Skin is not pale.     Findings: No bruising or erythema.   Neurological:     Mental Status: She is alert.           Assessment & Plan:  Chronic pain of right upper extremity Assessment & Plan: It seems that her pain originated from a tendinitis of the right shoulder quite some time ago which has since become problematic to the right elbow causing pain to the right upper extremity.  Consider Raynaud's phenomenon, especially given color changes.   She certainly has nerve involvement given her symptoms. Her veins do not appear engorged on today's exam.  No evidence of compartment syndrome, cellulitis.  She has great pulses.  Reassurance provided.    Agreed to proceed with x-rays per her request. X-ray of the hand and wrist to right side ordered and pending.  Continue gabapentin 300 mg twice daily.  Recommended that she get with PCP postoperatively and after recovery to discuss orthopedic referral for steroid injections of the right shoulder and potentially right elbow.  Orders: -     DG Hand 2 View Right -     DG Forearm Right     25 minutes was spent with patient face-to-face, taking notes for HPI, reviewing oncology records from [facility] per care everywhere, and discussing our plan moving forward.  See problem-based charting for assessment plan.   Doreene Nest, NP

## 2023-03-11 NOTE — Patient Instructions (Addendum)
Complete xray(s) prior to leaving today. I will notify you of your results once received.  Talk to [facility] about a referral to orthopedics for potential steroid injections to your shoulder and maybe elbow.  Wait until you recover from surgery.  It was a pleasure meeting you!

## 2023-03-15 HISTORY — PX: BOWEL RESECTION: SHX1257

## 2023-03-25 ENCOUNTER — Encounter: Payer: Self-pay | Admitting: Nurse Practitioner

## 2023-03-25 MED ORDER — GABAPENTIN 300 MG PO CAPS: 300.0000 mg | ORAL_CAPSULE | Freq: Two times a day (BID) | ORAL | 1 refills | Status: DC

## 2023-04-29 ENCOUNTER — Encounter: Payer: Self-pay | Admitting: Nurse Practitioner

## 2023-05-03 ENCOUNTER — Ambulatory Visit: Payer: Commercial Managed Care - PPO | Admitting: Primary Care

## 2023-05-03 ENCOUNTER — Encounter: Payer: Self-pay | Admitting: Primary Care

## 2023-05-03 VITALS — BP 118/72 | HR 63 | Temp 98.1°F | Ht 63.0 in | Wt 144.0 lb

## 2023-05-03 DIAGNOSIS — N6323 Unspecified lump in the left breast, lower outer quadrant: Secondary | ICD-10-CM

## 2023-05-03 DIAGNOSIS — N644 Mastodynia: Secondary | ICD-10-CM | POA: Diagnosis not present

## 2023-05-03 HISTORY — DX: Mastodynia: N64.4

## 2023-05-03 HISTORY — DX: Unspecified lump in the left breast, lower outer quadrant: N63.23

## 2023-05-03 NOTE — Patient Instructions (Signed)
Request a transfer of care appointment up front.   You will receive a phone call regarding the mammogram and ultrasound.  It was a pleasure to see you today!

## 2023-05-03 NOTE — Assessment & Plan Note (Signed)
Exam today representative of a lymph node.  Given new findings, coupled with pain, orders placed for diagnostic bilateral mammogram and ultrasounds. Little is known about family history.

## 2023-05-03 NOTE — Assessment & Plan Note (Signed)
Exam today with tenderness is noted. Discussed common causes for breast tenderness including hormonal changes.  She did resume OCPs 2 months ago,, this could be contributing.  Regardless, orders placed for diagnostic bilateral mammogram and ultrasounds.

## 2023-05-03 NOTE — Progress Notes (Signed)
Subjective:    Patient ID: Stefanie Parks, female    DOB: 1990/09/20, 32 y.o.   MRN: [id number]  HPI  Stefanie Parks is a very pleasant 32 y.o. female patient of Matt, NP with a history of migraines, nutcracker phenomenon of renal vein, anxiety depression, paresthesia, muscle tightness, chronic right upper extremity pain who presents today to discuss axillary and breast mass.   Her grandmother joins [place] today.  Acute skin mass to the left lateral breast/end of axillary tail with mild to moderate tenderness x 2 months. She discontinued gabapentin 300 mg TID for which she was using for chronic pain as she was feeling better. Since discontinuation, she's noticed increased pain to the left breast and axilla.  LMP was last week, completed 2 days ago. Previously managed on OCPs for history of heavy and painful menses, resumed OCPs 2 months ago.   She denies right axillary swelling, breast skin texture changes, breast masses, known family history of breast cancer.    Review of Systems  Constitutional:  Negative for unexpected weight change.  Genitourinary:        Left breast pain  Skin:  Negative for color change and rash.         Past Medical History:  Diagnosis Date   Concussion    x2   Migraines     Social History   Socioeconomic History   Marital status: Significant Other    Spouse name: Not on file   Number of children: 0   Years of education: Not on file   Highest education level: 12th grade  Occupational History   Not on file  Tobacco Use   Smoking status: Former    Types: Cigarettes   Smokeless tobacco: Never  Vaping Use   Vaping status: Every Day   Substances: Nicotine, Flavoring  Substance and Sexual Activity   Alcohol use: Not Currently   Drug use: No    Comment: delta 8   Sexual activity: Yes    Birth control/protection: None  Other Topics Concern   Not on file  Social History Narrative   Fulltime: [employer]      Hobbies:exercise, bike riding, active  Therapist, music   Social Determinants of Health   Financial Resource Strain: Low Risk  (05/02/2023)   Overall Financial Resource Strain (CARDIA)    Difficulty of Paying Living Expenses: Not hard at all  Food Insecurity: No Food Insecurity (05/02/2023)   Hunger Vital Sign    Worried About Running Out of Food in the Last Year: Never true    Ran Out of Food in the Last Year: Never true  Transportation Needs: No Transportation Needs (05/02/2023)   PRAPARE - Administrator, Civil Service (Medical): No    Lack of Transportation (Non-Medical): No  Physical Activity: Insufficiently Active (05/02/2023)   Exercise Vital Sign    Days of Exercise per Week: 2 days    Minutes of Exercise per Session: 20 min  Stress: No Stress Concern Present (05/02/2023)   [employer] of Occupational Health - Occupational Stress Questionnaire    Feeling of Stress : Only a little  Social Connections: Socially Isolated (05/02/2023)   Social Connection and Isolation Panel [NHANES]    Frequency of Communication with Friends and Family: Never    Frequency of Social Gatherings with Friends and Family: Twice a week    Attends Religious Services: Never    Database administrator or Organizations: No    Attends Banker Meetings: Not on  file    Marital Status: Living with partner  Intimate Partner Violence: Not on file    Past Surgical History:  Procedure Laterality Date   ANTERIOR CRUCIATE LIGAMENT REPAIR     right   BOWEL RESECTION  03/2023    Family History  Adopted: Yes    Allergies  Allergen Reactions   Dilaudid [Hydromorphone] Itching   Nickel    Wound Dressings Rash    Current Outpatient Medications on File Prior to Visit  Medication Sig Dispense Refill   butalbital-acetaminophen-caffeine (FIORICET) 50-325-40 MG tablet Take 1 tablet by mouth every 4 (four) hours as needed.     dicyclomine (BENTYL) 20 MG tablet Take 1 tablet (20 mg total) by mouth every 6 (six) hours. 120 tablet  0   fluticasone (FLONASE) 50 MCG/ACT nasal spray Place 2 sprays into both nostrils daily.     ibuprofen (ADVIL) 600 MG tablet Take 1 tablet (600 mg total) by mouth every 6 (six) hours as needed. 30 tablet 0   levonorgestrel-ethinyl estradiol (ALTAVERA) 0.15-30 MG-MCG tablet Take 1 tablet by mouth daily.     meclizine (ANTIVERT) 25 MG tablet Take 25 mg by mouth 3 (three) times daily as needed for dizziness.     ondansetron (ZOFRAN-ODT) 4 MG disintegrating tablet Take 1 tablet (4 mg total) by mouth every 8 (eight) hours as needed for nausea or vomiting. 20 tablet 0   sertraline (ZOLOFT) 100 MG tablet Take 2 tablets (200 mg total) by mouth daily. 60 tablet 0   traZODone (DESYREL) 50 MG tablet Take 1 tablet (50 mg total) by mouth at bedtime. 90 tablet 0   fluconazole (DIFLUCAN) 150 MG tablet Take 150 mg by mouth once. (Patient not taking: Reported on 05/03/2023)     gabapentin (NEURONTIN) 300 MG capsule Take 1 capsule (300 mg total) by mouth 2 (two) times daily. (Patient not taking: Reported on 05/03/2023) 60 capsule 1   No current facility-administered medications on file prior to visit.    BP 118/72   Pulse 63   Temp 98.1 F (36.7 C) (Oral)   Ht 5\' 3"  (1.6 m)   Wt 144 lb (65.3 kg)   SpO2 98%   BMI 25.51 kg/m  Objective:   Physical Exam Chest:  Breasts:    Right: No mass, skin change or tenderness.     Left: Mass and tenderness present. No swelling or skin change.       Comments: 0.5 cm rounded, tender, mobile mass to left outer breast at 3 o'clock position/end of axillary tail. Tenderness to entire left breast on exam.          Assessment & Plan:  Mass of lower outer quadrant of left breast Assessment & Plan: Exam today representative of a lymph node.  Given new findings, coupled with pain, orders placed for diagnostic bilateral mammogram and ultrasounds. Little is known about family history.   Orders: -     MM 3D DIAGNOSTIC MAMMOGRAM BILATERAL BREAST; Future -     [place]  LIMITED ULTRASOUND INCLUDING AXILLA LEFT BREAST ; Future -     [place] LIMITED ULTRASOUND INCLUDING AXILLA RIGHT BREAST; Future  Breast pain, left Assessment & Plan: Exam today with tenderness is noted. Discussed common causes for breast tenderness including hormonal changes.  She did resume OCPs 2 months ago,, this could be contributing.  Regardless, orders placed for diagnostic bilateral mammogram and ultrasounds.  Orders: -     MM 3D DIAGNOSTIC MAMMOGRAM BILATERAL BREAST; Future -  [place] LIMITED ULTRASOUND INCLUDING AXILLA LEFT BREAST ; Future -     [place] LIMITED ULTRASOUND INCLUDING AXILLA RIGHT BREAST; Future        Doreene Nest, NP

## 2023-05-06 ENCOUNTER — Ambulatory Visit: Payer: Commercial Managed Care - PPO | Admitting: Nurse Practitioner

## 2023-05-09 ENCOUNTER — Ambulatory Visit: Payer: Commercial Managed Care - PPO | Admitting: Nurse Practitioner

## 2023-05-11 ENCOUNTER — Ambulatory Visit
Admission: RE | Admit: 2023-05-11 | Discharge: 2023-05-11 | Disposition: A | Discharge status: Home / Self Care | Payer: Commercial Managed Care - PPO | Source: Ambulatory Visit | Attending: Primary Care | Admitting: Primary Care

## 2023-05-11 DIAGNOSIS — N644 Mastodynia: Secondary | ICD-10-CM

## 2023-05-11 DIAGNOSIS — N6323 Unspecified lump in the left breast, lower outer quadrant: Secondary | ICD-10-CM

## 2023-06-24 MED ORDER — PANTOPRAZOLE SODIUM 40 MG PO TBEC: 40.0000 mg | DELAYED_RELEASE_TABLET | Freq: Every day | ORAL | 0 refills | Status: DC

## 2023-06-24 NOTE — Addendum Note (Signed)
 Addended by: Eden Emms on: 06/24/2023 12:50 PM   Modules accepted: Orders

## 2023-08-31 ENCOUNTER — Encounter: Payer: Self-pay | Admitting: Primary Care

## 2023-08-31 ENCOUNTER — Ambulatory Visit: Payer: Commercial Managed Care - PPO | Admitting: Primary Care

## 2023-08-31 VITALS — BP 116/70 | HR 70 | Temp 98.1°F | Ht 63.0 in | Wt 141.0 lb

## 2023-08-31 DIAGNOSIS — R051 Acute cough: Secondary | ICD-10-CM

## 2023-08-31 DIAGNOSIS — F32A Depression, unspecified: Secondary | ICD-10-CM

## 2023-08-31 DIAGNOSIS — F419 Anxiety disorder, unspecified: Secondary | ICD-10-CM

## 2023-08-31 DIAGNOSIS — K219 Gastro-esophageal reflux disease without esophagitis: Secondary | ICD-10-CM | POA: Insufficient documentation

## 2023-08-31 DIAGNOSIS — I871 Compression of vein: Secondary | ICD-10-CM

## 2023-08-31 DIAGNOSIS — J101 Influenza due to other identified influenza virus with other respiratory manifestations: Secondary | ICD-10-CM | POA: Insufficient documentation

## 2023-08-31 DIAGNOSIS — G43109 Migraine with aura, not intractable, without status migrainosus: Secondary | ICD-10-CM | POA: Diagnosis not present

## 2023-08-31 DIAGNOSIS — F5104 Psychophysiologic insomnia: Secondary | ICD-10-CM

## 2023-08-31 LAB — POCT INFLUENZA A/B
Influenza A, POC: POSITIVE — AB
Influenza B, POC: POSITIVE — AB

## 2023-08-31 LAB — POC COVID19 BINAXNOW: SARS Coronavirus 2 Ag: NEGATIVE

## 2023-08-31 MED ORDER — OSELTAMIVIR PHOSPHATE 75 MG PO CAPS: 75.0000 mg | ORAL_CAPSULE | Freq: Two times a day (BID) | ORAL | 0 refills | Status: AC

## 2023-08-31 NOTE — Assessment & Plan Note (Signed)
 Controlled.  Continue Zoloft 200 mg daily. Agreed to take over refills as she is stable.  She will update when she needs refills.

## 2023-08-31 NOTE — Progress Notes (Signed)
 Subjective:    Patient ID: Stefanie Parks, female    DOB: [birth year], 33 y.o.   MRN: [id number]  Sore Throat  Associated symptoms include coughing and headaches. Pertinent negatives include no abdominal pain.    Stefanie Parks is a very pleasant 33 y.o. female patient of Matt, NP with a history of migraines, nutcracker phenomenon of renal vein, IBS, anxiety depression, ADHD, back pain, insomnia who presents today to transfer care.  1) Anxiety and Depression: Currently managed on Zoloft 200 mg daily, trazodone 50 mg at bedtime. Following with psychiatry, teledoc through work, would like to transfer her care to [place]. She does not use Trazodone as she has no difficulty sleeping.   2) Migraines: Chronic for years. Currently managed on Fioricet 50-325-40 mg PRN. She now has special glasses for her astigmatism and for screen time on her computer. Her symptoms have improved significantly.   3) Superior mesenteric artery syndrome: S/P duodenojejunostomy in October 2024 per Dr. Lenise Arena at [facility]. Has been released. Denies constipation and diarrhea. Her appetite has improved is able to eat solid food, has to be careful.   She is managed on pantoprazole 40 mg daily for which she has been taking for the last 6+ months for chronic esophageal reflux. She's undergone upper endoscopy in the past which re  Also with nutcracker phenomenon of renal vein.  Evaluated by vascular services through [facility] last year, no further workup needed.  4) Sore Throat: Symptom onset yesterday with sore throat, headache, post nasal drip with cough. She woke up today drenched in sweat. She has had sick contacts at work. She's not taken anything for her symptoms.    Review of Systems  Constitutional:  Positive for chills. Negative for fever.  HENT:  Positive for postnasal drip and sore throat.   Respiratory:  Positive for cough.   Gastrointestinal:  Negative for abdominal pain.  Neurological:  Positive for headaches.   Psychiatric/Behavioral:  The patient is not nervous/anxious.          Past Medical History:  Diagnosis Date   Atypical chest pain 12/30/2022   Breast pain, left 05/03/2023   Concussion    x2   Generalized abdominal pain 07/15/2022   Low back pain 07/28/2022   Mass of lower outer quadrant of left breast 05/03/2023   Migraines    Muscle tightness 07/28/2022   Nail pitting 06/29/2022   Paresthesia 07/28/2022   Thoracic spine pain 07/15/2022   Tick bite of right front wall of thorax 06/29/2022    Social History   Socioeconomic History   Marital status: Significant Other    Spouse name: Not on file   Number of children: 0   Years of education: Not on file   Highest education level: 12th grade  Occupational History   Not on file  Tobacco Use   Smoking status: Former    Types: Cigarettes   Smokeless tobacco: Never  Vaping Use   Vaping status: Every Day   Substances: Nicotine, Flavoring  Substance and Sexual Activity   Alcohol use: Not Currently   Drug use: No    Comment: delta 8   Sexual activity: Yes    Birth control/protection: None  Other Topics Concern   Not on file  Social History Narrative   Fulltime: [employer]      Hobbies:exercise, bike riding, active Therapist, music   Social Drivers of Health   Financial Resource Strain: Low Risk  (05/02/2023)   Overall Financial Resource Strain (CARDIA)    Difficulty of  Paying Living Expenses: Not hard at all  Food Insecurity: No Food Insecurity (05/02/2023)   Hunger Vital Sign    Worried About Running Out of Food in the Last Year: Never true    Ran Out of Food in the Last Year: Never true  Transportation Needs: No Transportation Needs (05/02/2023)   PRAPARE - Administrator, Civil Service (Medical): No    Lack of Transportation (Non-Medical): No  Physical Activity: Insufficiently Active (05/02/2023)   Exercise Vital Sign    Days of Exercise per Week: 2 days    Minutes of Exercise per Session: 20 min  Stress: No  Stress Concern Present (05/02/2023)   [employer] of Occupational Health - Occupational Stress Questionnaire    Feeling of Stress : Only a little  Social Connections: Socially Isolated (05/02/2023)   Social Connection and Isolation Panel [NHANES]    Frequency of Communication with Friends and Family: Never    Frequency of Social Gatherings with Friends and Family: Twice a week    Attends Religious Services: Never    Database administrator or Organizations: No    Attends Engineer, structural: Not on file    Marital Status: Living with partner  Intimate Partner Violence: Not on file    Past Surgical History:  Procedure Laterality Date   ANTERIOR CRUCIATE LIGAMENT REPAIR     right   BOWEL RESECTION  03/2023    Family History  Adopted: Yes    Allergies  Allergen Reactions   Dilaudid [Hydromorphone] Itching   Morphine Itching   Nickel    Wound Dressings Rash    Current Outpatient Medications on File Prior to Visit  Medication Sig Dispense Refill   butalbital-acetaminophen-caffeine (FIORICET) 50-325-40 MG tablet Take 1 tablet by mouth every 4 (four) hours as needed.     levonorgestrel-ethinyl estradiol (ALTAVERA) 0.15-30 MG-MCG tablet Take 1 tablet by mouth daily.     ondansetron (ZOFRAN-ODT) 4 MG disintegrating tablet Take 1 tablet (4 mg total) by mouth every 8 (eight) hours as needed for nausea or vomiting. 20 tablet 0   pantoprazole (PROTONIX) 40 MG tablet Take 1 tablet (40 mg total) by mouth daily. 90 tablet 0   sertraline (ZOLOFT) 100 MG tablet Take 2 tablets (200 mg total) by mouth daily. 60 tablet 0   No current facility-administered medications on file prior to visit.    BP 116/70   Pulse 70   Temp 98.1 F (36.7 C) (Oral)   Ht 5\' 3"  (1.6 m)   Wt 141 lb (64 kg)   LMP 08/22/2023 (Exact Date)   SpO2 99%   BMI 24.98 kg/m  Objective:   Physical Exam Cardiovascular:     Rate and Rhythm: Normal rate and regular rhythm.  Pulmonary:     Effort:  Pulmonary effort is normal.     Breath sounds: Normal breath sounds.  Musculoskeletal:     Cervical back: Neck supple.  Skin:    General: Skin is warm and dry.  Neurological:     Mental Status: She is alert and oriented to person, place, and time.  Psychiatric:        Mood and Affect: Mood normal.           Assessment & Plan:  Acute cough -     POCT Influenza A/B -     POC COVID-19 BinaxNow  Influenza A Assessment & Plan: Positive rapid influenza A test in the office today. Negative for COVID-19 infection.  Discussed options  for treatment, she opts for Tamiflu. Start Tamiflu 75 mg twice daily x 5 days. We discussed other conservative treatment.  Work note provided.  Orders: -     Oseltamivir Phosphate; Take 1 capsule (75 mg total) by mouth 2 (two) times daily.  Dispense: 10 capsule; Refill: 0  Migraine with aura and without status migrainosus, not intractable Assessment & Plan: Significantly improved.  Continue Fioricet 50-325-40 milligrams as needed for which she uses sparingly. Continue to monitor.   Nutcracker phenomenon of renal vein Assessment & Plan: Evaluated at [facility], no further workup needed.   Anxiety and depression Assessment & Plan: Controlled.  Continue Zoloft 200 mg daily. Agreed to take over refills as she is stable.  She will update when she needs refills.   Psychophysiological insomnia Assessment & Plan: Controlled.  Remain off trazodone   Gastroesophageal reflux disease without esophagitis Assessment & Plan: Reviewed upper endoscopy from July 2024 through [facility] per [facility]. Continue pantoprazole 40 mg daily for now.  We discussed potential dose reduction in the future.         Doreene Nest, NP

## 2023-08-31 NOTE — Patient Instructions (Signed)
 Start Tamiflu 75 mg twice daily for flu.   You can try a few things over the counter to help with your symptoms including:  Cough: Delsym or Robitussin (get the off brand, works just as well) Chest Congestion: Mucinex (plain) Nasal Congestion/Ear Pressure/Sinus Pressure: Try using Flonase (fluticasone) nasal spray. Instill 1 spray in each nostril twice daily. This can be purchased over the counter. Body aches, fevers, headache: Ibuprofen (not to exceed 2400 mg in 24 hours) or Acetaminophen-Tylenol (not to exceed 3000 mg in 24 hours) Runny Nose/Throat Drainage/Sneezing/Itchy or Watery Eyes: An antihistamine such as Zyrtec, Claritin, Xyzal, Allegra  It was a pleasure to see you today!

## 2023-08-31 NOTE — Assessment & Plan Note (Addendum)
 Controlled.  Remain off trazodone

## 2023-08-31 NOTE — Assessment & Plan Note (Signed)
 Evaluated at [facility], no further workup needed.

## 2023-08-31 NOTE — Assessment & Plan Note (Signed)
 Reviewed upper endoscopy from July 2024 through [facility] per [facility]. Continue pantoprazole 40 mg daily for now.  We discussed potential dose reduction in the future.

## 2023-08-31 NOTE — Assessment & Plan Note (Signed)
 Positive rapid influenza A test in the office today. Negative for COVID-19 infection.  Discussed options for treatment, she opts for Tamiflu. Start Tamiflu 75 mg twice daily x 5 days. We discussed other conservative treatment.  Work note provided.

## 2023-08-31 NOTE — Assessment & Plan Note (Signed)
 Significantly improved.  Continue Fioricet 50-325-40 milligrams as needed for which she uses sparingly. Continue to monitor.

## 2023-09-26 ENCOUNTER — Other Ambulatory Visit: Payer: Self-pay | Admitting: Nurse Practitioner

## 2023-10-26 DIAGNOSIS — F32A Depression, unspecified: Secondary | ICD-10-CM

## 2023-10-26 MED ORDER — SERTRALINE HCL 100 MG PO TABS: 200.0000 mg | ORAL_TABLET | Freq: Every day | ORAL | 2 refills | Status: AC
